# Patient Record
Sex: Male | Born: 1965
Health system: Southern US, Community
[De-identification: ages and names within clinical notes are randomized; demographics above are authoritative.]

## PROBLEM LIST (undated history)

## (undated) DIAGNOSIS — I219 Acute myocardial infarction, unspecified: Secondary | ICD-10-CM

## (undated) DIAGNOSIS — E785 Hyperlipidemia, unspecified: Secondary | ICD-10-CM

## (undated) HISTORY — PX: MOHS SURGERY: SUR867

## (undated) HISTORY — PX: SHOULDER SURGERY: SHX246

## (undated) HISTORY — PX: ROTATOR CUFF REPAIR: SHX139

## (undated) HISTORY — PX: CARDIAC CATHETERIZATION: SHX172

## (undated) HISTORY — DX: Acute myocardial infarction, unspecified: I21.9

## (undated) HISTORY — DX: Hyperlipidemia, unspecified: E78.5

## (undated) HISTORY — PX: CORONARY ANGIOPLASTY WITH STENT PLACEMENT: SHX49

---

## 2009-11-30 ENCOUNTER — Ambulatory Visit: Payer: Self-pay | Admitting: Internal Medicine

## 2013-09-08 ENCOUNTER — Ambulatory Visit: Payer: Self-pay | Admitting: Family Medicine

## 2013-09-08 LAB — CK-MB: CK-MB: 1.1 ng/mL (ref 0.5–3.6)

## 2013-09-08 LAB — TROPONIN I: TROPONIN-I: 0.07 ng/mL — AB

## 2013-09-08 LAB — CK: CK, TOTAL: 221 U/L

## 2013-09-16 ENCOUNTER — Encounter: Payer: Self-pay | Admitting: *Deleted

## 2013-09-17 ENCOUNTER — Ambulatory Visit (INDEPENDENT_AMBULATORY_CARE_PROVIDER_SITE_OTHER): Payer: BC Managed Care – PPO | Admitting: Cardiovascular Disease

## 2013-09-17 ENCOUNTER — Ambulatory Visit: Payer: Self-pay | Admitting: Cardiovascular Disease

## 2013-09-17 ENCOUNTER — Encounter: Payer: Self-pay | Admitting: Cardiovascular Disease

## 2013-09-17 VITALS — BP 164/96 | HR 91 | Ht 69.0 in | Wt 196.8 lb

## 2013-09-17 DIAGNOSIS — I10 Essential (primary) hypertension: Secondary | ICD-10-CM

## 2013-09-17 DIAGNOSIS — R079 Chest pain, unspecified: Secondary | ICD-10-CM

## 2013-09-17 DIAGNOSIS — R000 Tachycardia, unspecified: Secondary | ICD-10-CM | POA: Insufficient documentation

## 2013-09-17 DIAGNOSIS — K219 Gastro-esophageal reflux disease without esophagitis: Secondary | ICD-10-CM

## 2013-09-17 MED ORDER — METOPROLOL TARTRATE 25 MG PO TABS: 25.0000 mg | ORAL_TABLET | Freq: Two times a day (BID) | ORAL | Status: DC

## 2013-09-17 MED ORDER — AMLODIPINE BESYLATE 10 MG PO TABS: 10.0000 mg | ORAL_TABLET | Freq: Every day | ORAL | Status: DC

## 2013-09-17 MED ORDER — NITROGLYCERIN 0.4 MG SL SUBL: 0.4000 mg | SUBLINGUAL_TABLET | SUBLINGUAL | Status: DC | PRN

## 2013-09-17 MED ORDER — OMEPRAZOLE 20 MG PO CPDR: 20.0000 mg | DELAYED_RELEASE_CAPSULE | Freq: Two times a day (BID) | ORAL | Status: DC

## 2013-09-17 NOTE — Patient Instructions (Addendum)
Please start 1/2 amlodipine daily for  Blood pressure Start metoprolol 25 mg twice a day for BP and heart rate  Take NTG sl for chest pain Start aspirin 81 mg x 2  Start omeprazole twice a day for possible GERD  We will schedule a stress test for chest pain  Please call us if you have new issues that need to be addressed before your next appt.  Follow up 2 weeks   [facility]  Your caregiver has ordered a Stress Test with nuclear imaging. The purpose of this test is to evaluate the blood supply to your heart muscle. This procedure is referred to as a "Non-Invasive Stress Test." This is because other than having an IV started in your vein, nothing is inserted or "invades" your body. Cardiac stress tests are done to find areas of poor blood flow to the heart by determining the extent of coronary artery disease (CAD). Some patients exercise on a treadmill, which naturally increases the blood flow to your heart, while others who are  unable to walk on a treadmill due to physical limitations have a pharmacologic/chemical stress agent called Lexiscan . This medicine will mimic walking on a treadmill by temporarily increasing your coronary blood flow.   Please note: these test may take anywhere between 2-4 hours to complete  [facility] TO [facility]  THE VOLUNTEERS AT THE FIRST DESK WILL DIRECT YOU WHERE TO GO  Date of Procedure:___________Tuesday, Feb 24_______________  Arrival Time for Procedure:_________7:45am_____________________  Instructions regarding medication:    _X__:  Hold betablocker(s) night before procedure and morning of procedure:  METOPROLOL & AMLODIPINE    PLEASE NOTIFY THE OFFICE AT LEAST 24 HOURS IN ADVANCE IF YOU ARE UNABLE TO KEEP YOUR APPOINTMENT.  [phone] AND  PLEASE NOTIFY [facility] AT [facility] AT LEAST 24 HOURS IN ADVANCE IF YOU ARE UNABLE TO KEEP YOUR APPOINTMENT. [phone]  How to prepare for your Myoview test:  1. Do not eat  or drink after midnight 2. No caffeine for 24 hours prior to test 3. No smoking 24 hours prior to test. 4. Your medication may be taken with water.  If your doctor stopped a medication because of this test, do not take that medication. 5. Ladies, please do not wear dresses.  Skirts or pants are appropriate. Please wear a short sleeve shirt. 6. No perfume, cologne or lotion. 7. Wear comfortable walking shoes. No heels!

## 2013-09-17 NOTE — Assessment & Plan Note (Signed)
He does report some elevated heart rates in the setting of his chest discomfort. We will start metoprolol as above

## 2013-09-17 NOTE — Assessment & Plan Note (Addendum)
We have recommended that he start omeprazole 20 mg twice a day. Unable to exclude GERD, gastritis, hiatal hernia, ulcer as a cause of his pain

## 2013-09-17 NOTE — Assessment & Plan Note (Signed)
He reports blood pressure has been elevated even prior to his chest pain symptoms. Worse recently. We will start amlodipine 5 g daily and metoprolol 25 mg twice a day. If blood pressure continues to run high, we will increase these medications with slow titration upwards. Amlodipine can be increased up to 10 mg daily, if heart rate continues to run high, metoprolol could be increased up to 50 mg twice a day

## 2013-09-17 NOTE — Progress Notes (Signed)
   Patient ID: Harry [name], male    DOB: [birth year], 48 y.o.   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 He was referred by Dr. Allena KatzPatel  He reports that symptoms typically present with exertion. Last episode of symptoms/chest pain was this morning. He walked to the back of his property which [place] acres. Walking back he was walking quickly and had chest discomfort. Symptoms seemed to resolve with rest.  [facility] with similar symptoms. EKG was relatively nonspecific with no dramatic changes. Cardiac enzymes 0.07. He left AMA and did not go to the emergency room as recommended  Blood pressure has been running high and he wonders if his symptoms could be from severe hypertension. Heart rate seems to run faster in the setting of high blood pressure. He does have significant stress at work. His possibilities have increased on the job.  He does report having previous problems with GERD and he is been taking Zantac 2 pills per day with mild relief but continued chest pain symptoms.  EKG shows normal sinus rhythm with rate 91 beats per minute with T wave abnormality in V4 through V6, inferior leads   Outpatient Encounter Prescriptions as of 09/17/2013  Medication Sig  . Multiple Vitamin (MULTIVITAMIN) tablet Take 1 tablet by mouth daily.    Review of Systems  Constitutional: Negative.   HENT: Negative.   Eyes: Negative.   Respiratory: Negative.   Cardiovascular: Negative.   Gastrointestinal: Negative.   Endocrine: Negative.   Musculoskeletal: Negative.   Skin: Negative.   Allergic/Immunologic: Negative.   Neurological: Negative.   Hematological: Negative.   Psychiatric/Behavioral: Negative.   All other systems reviewed and are negative.    BP 164/96   Pulse 91  Ht 5\' 9"  (1.753 m)  Wt 196 lb 12 oz (89.245 kg)  BMI 29.04 kg/m2  Physical Exam  Nursing note and vitals reviewed. Constitutional: He is oriented to person, place, and time. He appears well-developed and well-nourished.  HENT:  Head: Normocephalic.  Nose: Nose normal.  Mouth/Throat: Oropharynx is clear and moist.  Eyes: Conjunctivae are normal. Pupils are equal, round, and reactive to light.  Neck: Normal range of motion. Neck supple. No JVD present.  Cardiovascular: Normal rate, regular rhythm, S1 normal, S2 normal, normal heart sounds and intact distal pulses.  Exam reveals no gallop and no friction rub.   No murmur heard. Pulmonary/Chest: Effort normal and breath sounds normal. No respiratory distress. He has no wheezes. He has no rales. He exhibits no tenderness.  Abdominal: Soft. Bowel sounds are normal. He exhibits no distension. There is no tenderness.  Musculoskeletal: Normal range of motion. He exhibits no edema and no tenderness.  Lymphadenopathy:    He has no cervical adenopathy.  Neurological: He is alert and oriented to person, place, and time. Coordination normal.  Skin: Skin is warm and dry. No rash noted. No erythema.  Psychiatric: He has a normal mood and affect. His behavior is normal. Judgment and thought content normal.      Assessment and Plan

## 2013-09-17 NOTE — Assessment & Plan Note (Addendum)
For chest pain symptoms, it is unclear if this is from ischemia or other etiology such as GERD. We will treated aggressively and I suggested he start aspirin daily, take nitroglycerin sublingual for any chest pain that does not resolve relatively rapidly.  For blood pressure control we will start amlodipine and metoprolol  PPI medication as above  Stress test has been ordered for next week Suggested he go to the emergency room over the weekend if he has recurrent worsening chest pain symptoms. If this happens, would probably proceed with a catheterization.

## 2013-09-21 ENCOUNTER — Ambulatory Visit: Payer: Self-pay | Admitting: Cardiovascular Disease

## 2013-09-21 DIAGNOSIS — R079 Chest pain, unspecified: Secondary | ICD-10-CM

## 2013-09-22 ENCOUNTER — Encounter: Payer: Self-pay | Admitting: Cardiovascular Disease

## 2013-09-26 ENCOUNTER — Inpatient Hospital Stay: Payer: Self-pay | Admitting: Internal Medicine

## 2013-09-26 LAB — BASIC METABOLIC PANEL
ANION GAP: 2 — AB (ref 7–16)
BUN: 12 mg/dL (ref 7–18)
Calcium, Total: 8.4 mg/dL — ABNORMAL LOW (ref 8.5–10.1)
Chloride: 106 mmol/L (ref 98–107)
Co2: 28 mmol/L (ref 21–32)
Creatinine: 0.87 mg/dL (ref 0.60–1.30)
EGFR (African American): >60
GLUCOSE: 120 mg/dL — AB (ref 65–99)
Osmolality: 273 (ref 275–301)
Potassium: 4.1 mmol/L (ref 3.5–5.1)
SODIUM: 136 mmol/L (ref 136–145)

## 2013-09-26 LAB — CK: CK, Total: 302 U/L

## 2013-09-26 LAB — APTT
ACTIVATED PTT: 85.1 s — AB (ref 23.6–35.9)
Activated PTT: 32.6 secs (ref 23.6–35.9)

## 2013-09-26 LAB — LIPID PANEL
Cholesterol: 185 mg/dL (ref 0–200)
HDL: 24 mg/dL — AB (ref 40–60)
TRIGLYCERIDES: 712 mg/dL — AB (ref 0–200)

## 2013-09-26 LAB — CBC
HCT: 48.1 % (ref 40.0–52.0)
HGB: 16.6 g/dL (ref 13.0–18.0)
MCH: 31.2 pg (ref 26.0–34.0)
MCHC: 34.4 g/dL (ref 32.0–36.0)
MCV: 91 fL (ref 80–100)
Platelet: 179 10*3/uL (ref 150–440)
RBC: 5.3 10*6/uL (ref 4.40–5.90)
RDW: 13 % (ref 11.5–14.5)
WBC: 11.7 10*3/uL — ABNORMAL HIGH (ref 3.8–10.6)

## 2013-09-26 LAB — CK-MB
CK-MB: 2.9 ng/mL (ref 0.5–3.6)
CK-MB: 3.7 ng/mL — ABNORMAL HIGH (ref 0.5–3.6)
CK-MB: 5.1 ng/mL — ABNORMAL HIGH (ref 0.5–3.6)

## 2013-09-26 LAB — TROPONIN I
Troponin-I: 0.13 ng/mL — ABNORMAL HIGH
Troponin-I: 0.49 ng/mL — ABNORMAL HIGH
Troponin-I: 0.79 ng/mL — ABNORMAL HIGH

## 2013-09-27 ENCOUNTER — Encounter: Payer: Self-pay | Admitting: Cardiovascular Disease

## 2013-09-27 DIAGNOSIS — I214 Non-ST elevation (NSTEMI) myocardial infarction: Secondary | ICD-10-CM

## 2013-09-27 DIAGNOSIS — R7989 Other specified abnormal findings of blood chemistry: Secondary | ICD-10-CM

## 2013-09-27 DIAGNOSIS — I2510 Atherosclerotic heart disease of native coronary artery without angina pectoris: Secondary | ICD-10-CM

## 2013-09-27 DIAGNOSIS — R079 Chest pain, unspecified: Secondary | ICD-10-CM

## 2013-09-27 DIAGNOSIS — I10 Essential (primary) hypertension: Secondary | ICD-10-CM

## 2013-09-27 DIAGNOSIS — E785 Hyperlipidemia, unspecified: Secondary | ICD-10-CM

## 2013-09-27 LAB — CBC WITH DIFFERENTIAL/PLATELET
Basophil #: 0 10*3/uL (ref 0.0–0.1)
Basophil %: 0.4 %
EOS PCT: 2.5 %
Eosinophil #: 0.2 10*3/uL (ref 0.0–0.7)
HCT: 45.1 % (ref 40.0–52.0)
HGB: 14.9 g/dL (ref 13.0–18.0)
LYMPHS ABS: 3.3 10*3/uL (ref 1.0–3.6)
Lymphocyte %: 39 %
MCH: 30.4 pg (ref 26.0–34.0)
MCHC: 33.1 g/dL (ref 32.0–36.0)
MCV: 92 fL (ref 80–100)
Monocyte #: 0.7 x10 3/mm (ref 0.2–1.0)
Monocyte %: 7.7 %
NEUTROS ABS: 4.2 10*3/uL (ref 1.4–6.5)
NEUTROS PCT: 50.4 %
Platelet: 149 10*3/uL — ABNORMAL LOW (ref 150–440)
RBC: 4.91 10*6/uL (ref 4.40–5.90)
RDW: 13.6 % (ref 11.5–14.5)
WBC: 8.4 10*3/uL (ref 3.8–10.6)

## 2013-09-27 LAB — BASIC METABOLIC PANEL
ANION GAP: 4 — AB (ref 7–16)
BUN: 11 mg/dL (ref 7–18)
CHLORIDE: 107 mmol/L (ref 98–107)
CREATININE: 0.97 mg/dL (ref 0.60–1.30)
Calcium, Total: 8.3 mg/dL — ABNORMAL LOW (ref 8.5–10.1)
Co2: 27 mmol/L (ref 21–32)
EGFR (African American): >60
EGFR (Non-African Amer.): >60
Glucose: 96 mg/dL (ref 65–99)
Osmolality: 275 (ref 275–301)
Potassium: 3.9 mmol/L (ref 3.5–5.1)
Sodium: 138 mmol/L (ref 136–145)

## 2013-09-27 LAB — TROPONIN I: TROPONIN-I: 2.7 ng/mL — AB

## 2013-09-27 LAB — MAGNESIUM: MAGNESIUM: 1.8 mg/dL

## 2013-09-27 LAB — CK TOTAL AND CKMB (NOT AT ARMC)
CK, Total: 282 U/L
CK-MB: 19.9 ng/mL — AB (ref 0.5–3.6)

## 2013-09-27 LAB — APTT: Activated PTT: 68.9 secs — ABNORMAL HIGH (ref 23.6–35.9)

## 2013-09-28 ENCOUNTER — Encounter: Payer: Self-pay | Admitting: Cardiovascular Disease

## 2013-09-28 DIAGNOSIS — I214 Non-ST elevation (NSTEMI) myocardial infarction: Secondary | ICD-10-CM

## 2013-09-28 LAB — BASIC METABOLIC PANEL
ANION GAP: 7 (ref 7–16)
BUN: 11 mg/dL (ref 7–18)
CALCIUM: 8.9 mg/dL (ref 8.5–10.1)
Chloride: 106 mmol/L (ref 98–107)
Co2: 26 mmol/L (ref 21–32)
Creatinine: 1 mg/dL (ref 0.60–1.30)
EGFR (African American): >60
GLUCOSE: 102 mg/dL — AB (ref 65–99)
Osmolality: 277 (ref 275–301)
POTASSIUM: 4.3 mmol/L (ref 3.5–5.1)
Sodium: 139 mmol/L (ref 136–145)

## 2013-09-29 ENCOUNTER — Encounter: Payer: Self-pay | Admitting: Cardiovascular Disease

## 2013-10-06 ENCOUNTER — Encounter: Payer: Self-pay | Admitting: Cardiovascular Disease

## 2013-10-06 ENCOUNTER — Ambulatory Visit (INDEPENDENT_AMBULATORY_CARE_PROVIDER_SITE_OTHER): Payer: BC Managed Care – PPO | Admitting: Cardiovascular Disease

## 2013-10-06 VITALS — BP 120/84 | Resp 72 | Ht 69.0 in | Wt 197.5 lb

## 2013-10-06 DIAGNOSIS — I10 Essential (primary) hypertension: Secondary | ICD-10-CM

## 2013-10-06 DIAGNOSIS — R000 Tachycardia, unspecified: Secondary | ICD-10-CM

## 2013-10-06 DIAGNOSIS — E785 Hyperlipidemia, unspecified: Secondary | ICD-10-CM

## 2013-10-06 DIAGNOSIS — I2510 Atherosclerotic heart disease of native coronary artery without angina pectoris: Secondary | ICD-10-CM | POA: Insufficient documentation

## 2013-10-06 DIAGNOSIS — K219 Gastro-esophageal reflux disease without esophagitis: Secondary | ICD-10-CM

## 2013-10-06 NOTE — Assessment & Plan Note (Signed)
He'll continue on omeprazole

## 2013-10-06 NOTE — Assessment & Plan Note (Addendum)
Cardiac catheterization last week showing severe proximal LAD disease. Stent placed no resolution of symptoms. We'll continue dual platelet therapy likely indefinitely. He'll continue beta blocker and statin

## 2013-10-06 NOTE — Assessment & Plan Note (Signed)
Blood pressure is well controlled on today's visit. No changes made to the medications. 

## 2013-10-06 NOTE — Progress Notes (Signed)
Patient ID: Harry [name], male    DOB: 03-16-1966, 48 y.o.   MRN: [id number]  HPI Comments: Mr. 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 Allena KatzPatel. He presents for followup after cardiac catheterization.  Initially he had a treadmill Myoview that showed no significant ischemia. This was done 09/22/2013. He had 1 mm ST depression in V5 and V6 at peak stress, attenuation corrected pictures showing apical thinning with no ischemia. Clinical correlation was recommended given the EKG changes and apical thinning.  He continued to have chest pain and presented to the hospital and underwent cardiac catheterization that showed 90+ percent lesion of his proximal LAD. DES was placed. No complications while in the hospital. In followup today, he denies any further episodes of chest pain. Overall he feels well, relieved that his symptoms have resolved. He is tolerating low-dose aspirin with effient  EKG shows normal sinus rhythm with rate 72 beats per minute with nonspecific ST and T wave abnormality in inferior leads, anterolateral leads   Outpatient Encounter Prescriptions as of 10/06/2013  Medication Sig  . amLODipine (NORVASC) 10 MG tablet Take 1 tablet (10 mg total) by mouth daily.  Marland Kitchen. aspirin 81 MG tablet Take 81 mg by mouth daily.  . Atorvastatin Calcium (LIPITOR PO) Take 40 mg by mouth daily.   . metoprolol tartrate (LOPRESSOR) 25 MG tablet Take 1 tablet (25 mg total) by mouth 2 (two) times daily.  . Multiple Vitamin (MULTIVITAMIN) tablet Take 1 tablet by mouth daily.  . nitroGLYCERIN (NITROSTAT) 0.4 MG SL tablet Place 1 tablet (0.4 mg total) under the tongue every 5 (five) minutes as needed for chest pain.  Marland Kitchen. omeprazole (PRILOSEC) 20 MG capsule Take 1 capsule (20 mg total) by mouth 2 (two) times daily before a meal.  . Prasugrel HCl  (EFFIENT PO) Take 10 mg by mouth daily.     Review of Systems  Constitutional: Negative.   HENT: Negative.   Eyes: Negative.   Respiratory: Negative.   Cardiovascular: Negative.   Gastrointestinal: Negative.   Endocrine: Negative.   Musculoskeletal: Negative.   Skin: Negative.   Allergic/Immunologic: Negative.   Neurological: Negative.   Hematological: Negative.   Psychiatric/Behavioral: Negative.   All other systems reviewed and are negative.    BP 120/84  Resp 72  Ht 5\' 9"  (1.753 m)  Wt 197 lb 8 oz (89.585 kg)  BMI 29.15 kg/m2  Physical Exam  Nursing note and vitals reviewed. Constitutional: He is oriented to person, place, and time. He appears well-developed and well-nourished.  HENT:  Head: Normocephalic.  Nose: Nose normal.  Mouth/Throat: Oropharynx is clear and moist.  Eyes: Conjunctivae are normal. Pupils are equal, round, and reactive to light.  Neck: Normal range of motion. Neck supple. No JVD present.  Cardiovascular: Normal rate, regular rhythm, S1 normal, S2 normal, normal heart sounds and intact distal pulses.  Exam reveals no gallop and no friction rub.   No murmur heard. Pulmonary/Chest: Effort normal and breath sounds normal. No respiratory distress. He has no wheezes. He has no rales. He exhibits no tenderness.  Abdominal: Soft. Bowel sounds are normal. He exhibits no distension. There is no tenderness.  Musculoskeletal: Normal range of motion. He exhibits no edema and no tenderness.  Lymphadenopathy:    He has no cervical adenopathy.  Neurological: He is alert and oriented  to person, place, and time. Coordination normal.  Skin: Skin is warm and dry. No rash noted. No erythema.  Psychiatric: He has a normal mood and affect. His behavior is normal. Judgment and thought content normal.      Assessment and Plan

## 2013-10-06 NOTE — Patient Instructions (Addendum)
You are doing well. No medication changes were made.  Cholesterol in 3 months, fasting  Please call us if you have new issues that need to be addressed before your next appt.  Your physician wants you to follow-up in: 6 months.  You will receive a reminder letter in the mail two months in advance. If you don't receive a letter, please call our office to schedule the follow-up appointment.

## 2013-11-11 ENCOUNTER — Telehealth: Payer: Self-pay | Admitting: *Deleted

## 2013-11-11 NOTE — Telephone Encounter (Signed)
[facility] pt is not interested in rehab because he does not have time, no desire, and children in daycare

## 2013-12-24 NOTE — Progress Notes (Signed)
 Subjective:     Patient ID: Harry [name] is a 48 y.o. male.  HPI : PATIENT FELL 2 WKS AGO AND LANDED ON HIS LEFT KNEE. HAS A HEALING LACERATION. NO SIGNS OF INFECTION. STATES THERE WAS A LARGE HEMATOMA INITIALLY. NOW WITH BRUISING AROUND HIS ANKLE. STATES HIS LOWER EXTREM HAVE BEEN SWELLIN SINCE HE HAD A STINT PLACED 4 WKS AGO AND STARTED TAKING HIS CURRENT MEDICATION. TET UPT.   Past Medical History:  has a past medical history of CAD (coronary artery disease); Hypertension; and Hyperlipidemia. Past Surgical History:  has no past surgical history on file. Social History:  reports that he has never smoked. He has never used smokeless tobacco. His alcohol and drug histories are not on file. Current Medications: has a current medication list which includes the following prescription(s): amlodipine  besylate, aspirin, atorvastatin  calcium , metoprolol  tartrate, multivitamin, and prasugrel . Allergies: has No Known Allergies.  This an established patient is here today for: office visit.  ROS     Objective:   Physical Exam  Constitutional: He appears well-developed and well-nourished.  Cardiovascular: Normal rate and regular rhythm.   Pulmonary/Chest: Effort normal.  Musculoskeletal:  HEALING LACERATION INFERIOR LEFT KNEE. ECHYMOSIS NOTED LEFT ANKLE MEDIAL AND LATERAL. ROM INTACT.   Nursing note and vitals reviewed.      Assessment:     Laceration of left knee, initial encounter  Hematoma  Stasis edema of both lower extremities      Plan:     Patient Instructions  ELEVATE FEET ABOVE WAIST AS MUCH AS POSSIBLE. FOLLOW UP WITH YOUR CARDIOLOGIST AND DISCUSS THE ON GOING SWELLING OF YOUR LOWER EXTREM.        Hematoma A hematoma is a collection of blood under the skin, in an organ, in a body space, in a joint space, or in other tissue. The blood can clot to form a lump that you can see and feel. The lump is often firm and may sometimes become sore and tender. Most hematomas  get better in a few days to weeks. However, some hematomas may be serious and require medical care. Hematomas can range in size from very small to very large. CAUSES  A hematoma can be caused by a blunt or penetrating injury. It can also be caused by spontaneous leakage from a blood vessel under the skin. Spontaneous leakage from a blood vessel is more likely to occur in older people, especially those taking blood thinners. Sometimes, a hematoma can develop after certain medical procedures. SIGNS AND SYMPTOMS  A firm lump on the body. Possible pain and tenderness in the area. Bruising.Blue, dark blue, purple-red, or yellowish skin may appear at the site of the hematoma if the hematoma is close to the surface of the skin. For hematomas in deeper tissues or body spaces, the signs and symptoms may be subtle. For example, an intra-abdominal hematoma may cause abdominal pain, weakness, fainting, and shortness of breath. An intracranial hematoma may cause a headache or symptoms such as weakness, trouble speaking, or a change in consciousness. DIAGNOSIS  A hematoma can usually be diagnosed based on your medical history and a physical exam. Imaging tests may be needed if your health care provider suspects a hematoma in deeper tissues or body spaces, such as the abdomen, head, or chest. These tests may include ultrasonography or a CT scan.  TREATMENT  Hematomas usually go away on their own over time. Rarely does the blood need to be drained out of the body. Large hematomas or those  that may affect vital organs will sometimes need surgical drainage or monitoring. HOME CARE INSTRUCTIONS  Apply ice to the injured area:  Put ice in a plastic bag.  Place a towel between your skin and the bag.  Leave the ice on for 20 minutes, 2-3 times a day for the first 1 to 2 days.  After the first 2 days, switch to using warm compresses on the hematoma.  Elevate the injured area to help decrease pain and swelling.  Wrapping the area with an elastic bandage may also be helpful. Compression helps to reduce swelling and promotes shrinking of the hematoma. Make sure the bandage is not wrapped too tight.  If your hematoma is on a lower extremity and is painful, crutches may be helpful for a couple days.  Only take over-the-counter or prescription medicines as directed by your health care provider. SEEK IMMEDIATE MEDICAL CARE IF:  You have increasing pain, or your pain is not controlled with medicine.  You have a fever.  You have worsening swelling or discoloration.  Your skin over the hematoma breaks or starts bleeding.  Your hematoma is in your chest or abdomen and you have weakness, shortness of breath, or a change in consciousness. Your hematoma is on your scalp (caused by a fall or injury) and you have a worsening headache or a change in alertness or consciousness. MAKE SURE YOU:  Understand these instructions. Will watch your condition. Will get help right away if you are not doing well or get worse. Document Released: 02/27/2004 Document Revised: 03/17/2013 Document Reviewed: 12/23/2012 [facility] Patient Information 2015 [place], [place]. This information is not intended to replace advice given to you by your health care provider. Make sure you discuss any questions you have with your health care provider. Contusion A contusion is a deep bruise. Contusions are the result of an injury that caused bleeding under the skin. The contusion may turn blue, purple, or yellow. Minor injuries will give you a painless contusion, but more severe contusions may stay painful and swollen for a few weeks.  CAUSES  A contusion is usually caused by a blow, trauma, or direct force to an area of the body. SYMPTOMS  Swelling and redness of the injured area. Bruising of the injured area. Tenderness and soreness of the injured area. Pain. DIAGNOSIS  The diagnosis can be made by taking a history and physical exam. An  X-ray, CT scan, or MRI may be needed to determine if there were any associated injuries, such as fractures. TREATMENT  Specific treatment will depend on what area of the body was injured. In general, the best treatment for a contusion is resting, icing, elevating, and applying cold compresses to the injured area. Over-the-counter medicines may also be recommended for pain control. Ask your caregiver what the best treatment is for your contusion. HOME CARE INSTRUCTIONS  Put ice on the injured area. Put ice in a plastic bag. Place a towel between your skin and the bag. Leave the ice on for 15-20 minutes, 3-4 times a day, or as directed by your health care provider. Only take over-the-counter or prescription medicines for pain, discomfort, or fever as directed by your caregiver. Your caregiver may recommend avoiding anti-inflammatory medicines (aspirin, ibuprofen, and naproxen) for 48 hours because these medicines may increase bruising. Rest the injured area. If possible, elevate the injured area to reduce swelling. SEEK IMMEDIATE MEDICAL CARE IF:  You have increased bruising or swelling. You have pain that is getting worse. Your swelling or  pain is not relieved with medicines. MAKE SURE YOU:  Understand these instructions. Will watch your condition. Will get help right away if you are not doing well or get worse. Document Released: 04/24/2005 Document Revised: 07/20/2013 Document Reviewed: 05/20/2011 [facility] Patient Information 2015 [place], [place]. This information is not intended to replace advice given to you by your health care provider. Make sure you discuss any questions you have with your health care provider.

## 2014-01-06 ENCOUNTER — Other Ambulatory Visit: Payer: BC Managed Care – PPO

## 2014-01-07 ENCOUNTER — Ambulatory Visit (INDEPENDENT_AMBULATORY_CARE_PROVIDER_SITE_OTHER): Payer: BC Managed Care – PPO

## 2014-01-07 DIAGNOSIS — Z79899 Other long term (current) drug therapy: Secondary | ICD-10-CM

## 2014-01-07 DIAGNOSIS — I2510 Atherosclerotic heart disease of native coronary artery without angina pectoris: Secondary | ICD-10-CM

## 2014-01-08 LAB — LIPID PANEL
CHOLESTEROL TOTAL: 133 mg/dL (ref 100–199)
Chol/HDL Ratio: 3.9 ratio units (ref 0.0–5.0)
HDL: 34 mg/dL — ABNORMAL LOW (ref >39)
LDL CALC: 61 mg/dL (ref 0–99)
Triglycerides: 191 mg/dL — ABNORMAL HIGH (ref 0–149)
VLDL CHOLESTEROL CAL: 38 mg/dL (ref 5–40)

## 2014-01-08 LAB — HEPATIC FUNCTION PANEL
ALT: 63 IU/L — AB (ref 0–44)
AST: 38 IU/L (ref 0–40)
Albumin: 4.7 g/dL (ref 3.5–5.5)
Alkaline Phosphatase: 136 IU/L — ABNORMAL HIGH (ref 39–117)
Bilirubin, Direct: 0.19 mg/dL (ref 0.00–0.40)
Total Bilirubin: 1.1 mg/dL (ref 0.0–1.2)
Total Protein: 7.1 g/dL (ref 6.0–8.5)

## 2014-01-19 ENCOUNTER — Other Ambulatory Visit: Payer: Self-pay | Admitting: *Deleted

## 2014-01-19 MED ORDER — ATORVASTATIN CALCIUM 40 MG PO TABS: 40.0000 mg | ORAL_TABLET | Freq: Every day | ORAL | Status: DC

## 2014-01-19 NOTE — Telephone Encounter (Signed)
Requested Prescriptions   Signed Prescriptions Disp Refills  . atorvastatin (LIPITOR) 40 MG tablet 30 tablet 3    Sig: Take 1 tablet (40 mg total) by mouth daily.    Authorizing Marguerette Sheller: Antonieta IbaGOLLAN, TIMOTHY J    Ordering User: Kendrick FriesLOPEZ, MARINA C

## 2014-01-27 ENCOUNTER — Other Ambulatory Visit: Payer: Self-pay | Admitting: *Deleted

## 2014-01-27 MED ORDER — ATORVASTATIN CALCIUM 40 MG PO TABS: 40.0000 mg | ORAL_TABLET | Freq: Every day | ORAL | Status: DC

## 2014-05-02 ENCOUNTER — Other Ambulatory Visit: Payer: Self-pay

## 2014-05-02 ENCOUNTER — Other Ambulatory Visit: Payer: Self-pay | Admitting: *Deleted

## 2014-05-02 MED ORDER — METOPROLOL TARTRATE 25 MG PO TABS: 25.0000 mg | ORAL_TABLET | Freq: Two times a day (BID) | ORAL | Status: DC

## 2014-05-02 MED ORDER — ATORVASTATIN CALCIUM 40 MG PO TABS: 40.0000 mg | ORAL_TABLET | Freq: Every day | ORAL | Status: DC

## 2014-05-02 MED ORDER — PRASUGREL HCL 10 MG PO TABS: 10.0000 mg | ORAL_TABLET | Freq: Every day | ORAL | Status: DC

## 2014-05-02 MED ORDER — OMEPRAZOLE 20 MG PO CPDR: 20.0000 mg | DELAYED_RELEASE_CAPSULE | Freq: Two times a day (BID) | ORAL | Status: DC

## 2014-05-02 NOTE — Telephone Encounter (Signed)
Pt is out of effient

## 2014-05-04 ENCOUNTER — Encounter: Payer: Self-pay | Admitting: Cardiovascular Disease

## 2014-05-04 ENCOUNTER — Ambulatory Visit (INDEPENDENT_AMBULATORY_CARE_PROVIDER_SITE_OTHER): Payer: BC Managed Care – PPO | Admitting: Cardiovascular Disease

## 2014-05-04 VITALS — BP 130/90 | HR 72 | Ht 70.0 in | Wt 194.5 lb

## 2014-05-04 DIAGNOSIS — I2510 Atherosclerotic heart disease of native coronary artery without angina pectoris: Secondary | ICD-10-CM

## 2014-05-04 DIAGNOSIS — R000 Tachycardia, unspecified: Secondary | ICD-10-CM

## 2014-05-04 DIAGNOSIS — I10 Essential (primary) hypertension: Secondary | ICD-10-CM

## 2014-05-04 DIAGNOSIS — E785 Hyperlipidemia, unspecified: Secondary | ICD-10-CM

## 2014-05-04 MED ORDER — PRASUGREL HCL 10 MG PO TABS: 10.0000 mg | ORAL_TABLET | Freq: Every day | ORAL | Status: DC

## 2014-05-04 NOTE — Patient Instructions (Addendum)
You are doing well. No medication changes were made.  Please call us if you have new issues that need to be addressed before your next appt.  Your physician wants you to follow-up in: 12 months.  You will receive a reminder letter in the mail two months in advance. If you don't receive a letter, please call our office to schedule the follow-up appointment. 

## 2014-05-04 NOTE — Assessment & Plan Note (Signed)
Cholesterol is at goal on the current lipid regimen. No changes to the medications were made.  

## 2014-05-04 NOTE — Progress Notes (Signed)
Patient ID: Luis AbedChristophe A Pitones, male    DOB: Jul 06, 1966, 48 y.o.   MRN: [id number]  HPI Comments: 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 DES placed.  patient of Dr. Allena KatzPatel.   In followup today, he reports that he is doing well. No further chest pain symptoms. 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 amLODipine (NORVASC) 10 MG tablet Take 1 tablet (10 mg total) by mouth daily.  Marland Kitchen. aspirin 81 MG tablet Take 81 mg by mouth daily.  Marland Kitchen. atorvastatin (LIPITOR) 40 MG tablet Take 1 tablet (40 mg total) by mouth daily.  . metoprolol tartrate (LOPRESSOR) 25 MG tablet Take 25 mg by mouth daily.  . Multiple Vitamin (MULTIVITAMIN) tablet Take 1 tablet by mouth daily.  . nitroGLYCERIN (NITROSTAT) 0.4 MG SL tablet Place 1 tablet (0.4 mg total) under the tongue every 5 (five) minutes as needed for chest pain.  Marland Kitchen. omeprazole (PRILOSEC) 20 MG capsule Take 1 capsule (20 mg total) by mouth 2 (two) times daily before a meal.  . prasugrel (EFFIENT) 10 MG TABS tablet Take 1 tablet (10 mg total) by mouth daily.    Review of Systems  Constitutional: Negative.   HENT: Negative.   Eyes: Negative.   Respiratory: Negative.   Cardiovascular: Negative.   Gastrointestinal: Negative.   Endocrine: Negative.    Musculoskeletal: Negative.   Skin: Negative.   Allergic/Immunologic: Negative.   Neurological: Negative.   Hematological: Negative.   Psychiatric/Behavioral: Negative.   All other systems reviewed and are negative.   BP 130/90  Pulse 72  Ht 5\' 10"  (1.778 m)  Wt 194 lb 8 oz (88.225 kg)  BMI 27.91 kg/m2  Physical Exam  Nursing note and vitals reviewed. Constitutional: He is oriented to person, place, and time. He appears well-developed and well-nourished.  HENT:  Head: Normocephalic.  Nose: Nose normal.  Mouth/Throat: Oropharynx is clear and moist.  Eyes: Conjunctivae are normal. Pupils are equal, round, and reactive to light.  Neck: Normal range of motion. Neck supple. No JVD present.  Cardiovascular: Normal rate, regular rhythm, S1 normal, S2 normal, normal heart sounds and intact distal pulses.  Exam reveals no gallop and no friction rub.   No murmur heard. Pulmonary/Chest: Effort normal and breath sounds normal. No respiratory distress. He has no wheezes. He has no rales. He exhibits no tenderness.  Abdominal: Soft. Bowel sounds are normal. He exhibits no distension. There is no tenderness.  Musculoskeletal: Normal range of motion. He exhibits no edema and no tenderness.  Lymphadenopathy:    He has no cervical adenopathy.  Neurological: He is alert and oriented to person, place, and time. Coordination normal.  Skin: Skin is warm and dry. No rash noted. No erythema.  Psychiatric: He has a normal mood and affect. His behavior is normal. Judgment and thought content normal.  Assessment and Plan

## 2014-05-04 NOTE — Assessment & Plan Note (Signed)
DES to the proximal LAD, stable with no recurrent anginal symptoms Continue aspirin with Effient. Co-pay card provided

## 2014-05-04 NOTE — Assessment & Plan Note (Signed)
He reduced the metoprolol dose secondary to low blood pressure. We'll continue him on his current regimen

## 2014-09-25 ENCOUNTER — Other Ambulatory Visit: Payer: Self-pay | Admitting: Cardiovascular Disease

## 2014-11-02 ENCOUNTER — Other Ambulatory Visit: Payer: Self-pay | Admitting: *Deleted

## 2014-11-02 MED ORDER — METOPROLOL TARTRATE 25 MG PO TABS: 25.0000 mg | ORAL_TABLET | Freq: Every day | ORAL | Status: DC

## 2014-11-06 ENCOUNTER — Other Ambulatory Visit: Payer: Self-pay | Admitting: Cardiovascular Disease

## 2014-11-19 NOTE — Discharge Summary (Signed)
PATIENT NAME:  Harry [name], Harry [name] MR#:  [id number] DATE OF BIRTH:  [birth year]  DATE OF ADMISSION:  09/26/2013 DATE OF DISCHARGE:  09/28/2013  For [name] detailed note, please take [name] look at the history and physical done on admission by Dr. Randol KernElgergawy.   DIAGNOSES AT DISCHARGE: 1.  Non-ST-elevation myocardial infarction.  2.  Hypertension.  3.  Hyperlipidemia.  4.  Gastroesophageal reflux disease.    DIET: The patient is being discharged on [name] low-sodium, low-fat diet.   ACTIVITY: As tolerated.   FOLLOWUP: Dr. Kirke CorinArida and Dr. Mariah MillingGollan in the next 1 to 2 weeks.   DISCHARGE MEDICATIONS: Amlodipine 10 mg daily, aspirin 81 mg daily, metoprolol tartrate 25 mg b.i.d., multivitamin daily, sublingual nitroglycerin as needed, omeprazole 20 mg b.i.d., Levitra 5 mg daily, atorvastatin 40 mg daily, Effient 10 mg daily.   CONSULTANTS DURING THE HOSPITAL COURSE: Dr. Lorine BearsMuhammad Arida and Dr. Dossie Arbourim Gollan from cardiology.   PERTINENT STUDIES DONE DURING THE HOSPITAL COURSE: [name] chest x-ray done on admission showing no acute cardiopulmonary process. [name] cardiac catheterization done on September 27, 2013, showing [name] 99% stenosis in the mid LAD. Status post drug-eluting stent placement.   HOSPITAL COURSE: This is [name] 49 year old male who presented to the hospital with worsening chest pain worrisome for unstable angina.   1.  Non-ST-elevation myocardial infarction. This was the likely diagnosis, given the patient's progressive chest pains and elevated troponin. The patient apparently had [name] Myoview done [name] week prior to admission which was essentially normal, but continued to have worsening chest pain and presented to the ER with [name] slightly elevated troponin. The patient was admitted to the hospital, started on aspirin, heparin drip, maintained on his beta blocker and statin. [name] cardiology consult was obtained. The patient was seen by Dr. Kirke CorinArida. The patient underwent [name] cardiac catheterization the day after admission, which showed [name] 99%  stenosis of the mid LAD. The patient underwent [name] drug-eluting stent placement to the mid LAD. Post cath, the patient has been chest pain-free and hemodynamically stable. He is therefore presently being discharged on his aspirin, Effient, beta blocker and statin as stated. The patient will have follow-up with his cardiologist as an outpatient in the next 1 to 2 weeks.  2.  Hypertension. The patient remained hemodynamically stable. He will continue his metoprolol.  3.  GERD. The patient was maintained on his Protonix. He will resume that.  4.  Hyperlipidemia. The patient was not on [name] statin prior to coming in, but given his recent cardiac stent placement, he has been started on low-dose atorvastatin.   CODE STATUS: The patient is [name] full code.   DISPOSITION: He is being discharged home.   TIME SPENT: 40 minutes.   ____________________________ Rolly PancakeVivek J. Cherlynn KaiserSainani, MD vjs:cg D: 09/28/2013 16:38:27 ET T: 09/29/2013 05:45:38 ET JOB#: [id number]  cc: Rolly PancakeVivek J. Cherlynn KaiserSainani, MD, <Dictator> Muhammad [name]. Kirke CorinArida, MD Antonieta Ibaimothy J. Gollan, MD Houston SirenVIVEK J SAINANI MD ELECTRONICALLY SIGNED 10/10/2013 22:34

## 2014-11-19 NOTE — Consult Note (Signed)
Admit Diagnosis:   CHEST PAIN: Onset Date: 26-Sep-2013, Status: Active, Description: CHEST PAIN   Present Illness Patient is a 49 yo who presents for evaluation of CP  Episodes of chest pain began a few weeks ago  He went to Urgent Care on 2/11.  Left AMA He was seen by Johnny Bridge in clinic on 2/20  Complained of chest heaviness with exertion. Also had some symtpoms that sugg possible reflux.  He underwent a stress myoview  Walked 8 min  EKG without changes  Myoview reported normal.   Yesterday evening he was going to get horses in before going to bed  This was after midnight  Wnet to barn  Pushed hay around  Started gettting chst heaviness  Came back to house  Difficult walking through snow  SOB Once he got into the house is when he started feeling like a truck was layingon his chest  Also complained of L arm discomfort and back pain.  Woke his wife.  Told her he needed to go to hosptital  She drove him in While walking into the hospital discomfort went away. he had a little discomfort in ER but none since.   Home Medications: Medication Instructions Status  amLODIPine 10 mg oral tablet 1 tab(s) orally once a day Active  aspirin 81 mg oral tablet 1 tab(s) orally once a day Active  Metoprolol Tartrate 25 mg oral tablet 1 tab(s) orally 2 times a day Active  multivitamin 1 tab(s) orally once a day Active  nitroglycerin 0.4 mg sublingual tablet 1 tab(s) sublingual every 5 minutes, As Needed - for Chest Pain Active  omeprazole 20 mg oral delayed release capsule 1 cap(s) orally 2 times a day Active  Levitra 5 mg oral tablet 1 tab(s) orally once a day, As Needed Active    No Known Allergies:   Case History and Physical Exam:  Chief Complaint Chest Pain   Past Medical Health Hypertension, GERD   Past Surgical History None   Primary Care Provider Cardiology:  Gollan   Family History Coronary Artery Disease  Sister:  ? CAD   HEENT PERLA   Neck/Nodes JVP normal  No bruits    Chest/Lungs Clear   Cardiovascular No Murmurs or Gallops  RRR  Normal S1, S2  No S3  No murmurs   Abdomen Supple, nontender.  No masses  No hepatomegaly.   Genitalia Not examined   Rectal Not examined   Musculoskeletal Full range of motion   Neurological Grossly WNL   Skin Dry  WNL   Nursing/Ancillary Notes: **Vital Signs.:   01-Mar-15 12:54  Temperature Temperature (F) 98.2  Celsius 36.7  Pulse Pulse 58  Respirations Respirations 23  Systolic BP Systolic BP 563  Diastolic BP (mmHg) Diastolic BP (mmHg) 58  Mean BP 77  Pulse Ox % Pulse Ox % 98  Oxygen Delivery Room Air/ 21 %  Pulse Ox Heart Rate 58   Routine Chem:  01-Mar-15 05:08   Result Comment VLDL/LDL - Unable to report VLDL and LDL due to a  - Triglyceride value that is 400 mg/dL or   - greater.  Result(s) reported on 26 Sep 2013 at 07:55AM.  Cholesterol, Serum 185  Triglycerides, Serum  712  HDL (INHOUSE)  24  VLDL Cholesterol Calculated SEE COMMENT  LDL Cholesterol Calculated SEE COMMENT  Glucose, Serum  120  BUN 12  Creatinine (comp) 0.87  Sodium, Serum 136  Potassium, Serum 4.1  Chloride, Serum 106  CO2, Serum 28  Calcium (  Total), Serum  8.4  Anion Gap  2  Osmolality (calc) 273  eGFR (African American) >60  eGFR (Non-African American) >60 (eGFR values <38m/min/1.73 m2 may be an indication of chronic kidney disease (CKD). Calculated eGFR is useful in patients with stable renal function. The eGFR calculation will not be reliable in acutely ill patients when serum creatinine is changing rapidly. It is not useful in  patients on dialysis. The eGFR calculation may not be applicable to patients at the low and high extremes of body sizes, pregnant women, and vegetarians.)  Cardiac:  01-Mar-15 05:08   Troponin I  0.13 (0.00-0.05 0.05 ng/mL or less: NEGATIVE  Repeat testing in 3-6 hrs  if clinically indicated. >0.05 ng/mL: POTENTIAL  MYOCARDIAL INJURY. Repeat  testing in 3-6 hrs if  clinically  indicated. NOTE: An increase or decrease  of 30% or more on serial  testing suggests a  clinically important change)  CPK-MB, Serum 2.9 (Result(s) reported on 26 Sep 2013 at 08:07AM.)  CK, Total 302 (39-308 NOTE: NEW REFERENCE RANGE  08/30/2013)  Routine Coag:  01-Mar-15 05:08   Activated PTT (APTT) 32.6 (A HCT value >55% may artifactually increase the APTT. In one study, the increase was an average of 19%. Reference: "Effect on Routine and Special Coagulation Testing Values of Citrate Anticoagulant Adjustment in Patients with High HCT Values." American Journal of Clinical Pathology 2006;126:400-405.)  Routine Hem:  01-Mar-15 05:08   WBC (CBC)  11.7  RBC (CBC) 5.30  Hemoglobin (CBC) 16.6  Hematocrit (CBC) 48.1  Platelet Count (CBC) 179 (Result(s) reported on 26 Sep 2013 at 05:23AM.)  MCV 91  MCH 31.2  MCHC 34.4  RDW 13.0   XRay:    01-Mar-15 05:20, Chest Portable Single View  Chest Portable Single View   REASON FOR EXAM:    CP  COMMENTS:       PROCEDURE: DXR - DXR PORTABLE CHEST SINGLE VIEW  - Sep 26 2013  5:20AM     CLINICAL DATA:  Chest pain for 2 weeks.    EXAM:  PORTABLE CHEST - 1 VIEW    COMPARISON:  None.    FINDINGS:  The lungs are well-aerated and clear. There is no evidence of focal  opacification, pleural effusion or pneumothorax.  The cardiomediastinal silhouette is within normal limits. No acute  osseous abnormalities are seen.     IMPRESSION:  No acute cardiopulmonary process seen.      Electronically Signed    By: JGarald BaldingM.D.    On: 09/26/2013 05:24         Verified By: JEFFREY . CHANG, M.D.,    Impression Patient is a 49yo with no known CAD  Has a 3 wk history of exertional chest discomfort  Myoview reported normal per patient. Presents after severe episode early this am  Trop is very mildly elevated Agree with heparin  May need to hold b blocker (HR and BP low)  Plan for L heart cath in AM Patient agrees to proceed.     Dyslipidemia.  Patient with signif elevation of trig  Will check Hgb A1C  Start statin for now.  May need additional agents  HTN  Follow   Electronic Signatures: RDorris Carnes(MD)  (Signed 01-Mar-15 15:18)  Authored: Health Issues, General Aspect/Present Illness, Home Medications, Allergies, History and Physical Exam, Vital Signs, Labs, Radiology, Impression/Plan   Last Updated: 01-Mar-15 15:18 by RDorris Carnes(MD)

## 2014-11-19 NOTE — H&P (Signed)
PATIENT NAME:  Harry [name], Harry [name] MR#:  [id number] DATE OF BIRTH:  03-28-1966  DATE OF ADMISSION:  09/26/2013  REFERRING PHYSICIAN:  Dr. Chiquita Loth.  PRIMARY CARE PHYSICIAN:  None.   CARDIOLOGY:  Dr. Mariah Milling.   CHIEF COMPLAINT:  Chest pain.   HISTORY OF PRESENT ILLNESS:  This is a 49 year old male with past medical history of hypertension, presents with complaints of chest pain, the patient reports his pain has been going off and on for the last two weeks where he saw Dr. Mariah Milling last week where he had his nuclear medicine stress test done on February 24th which was essentially negative, the patient presents today with what he describes as chest pain happening this morning, reports at 2:00 a.m. he went to the farm to take care of the horses, but reports he was doing some exertional physical activity, when he started to have midsternal chest pain, crushing in quality, radiating to the left arm, accompanied by mild shortness of breath, palpitation and nausea.  Denies any diaphoresis or lightheadedness or dizziness, upon presentation, the patient had his chest pain resolved by then, he already took two aspirin yesterday, but he took four baby aspirin at night, the patient reports he took half tablet of Levitra yesterday night, the patient's EKG did not show any changes.  His troponin came back elevated at 0.13, the patient was given nitro paste and started on a heparin drip.  Currently denies any chest pain, hospitalist service requested to admit the patient for further treatment of his non-STEMI.   PAST MEDICAL HISTORY:  Hypertension.   PAST SURGICAL HISTORY:  None.   SOCIAL HISTORY:  The patient uses smokeless tobacco, dipping, but no smoking.  No alcohol.  No illicit drug use.   FAMILY HISTORY:  Significant for congestive heart failure in his father, but denies any coronary artery disease at a young age.   ALLERGIES:  No known drug allergies.   HOME MEDICATIONS:  1.  Norvasc 10 mg daily.  2.   Aspirin 160 mg daily.  3.  Metoprolol 25 mg twice daily.  4.  Multivitamin 1 tablet daily.  5.  Sublingual nitro as needed.  6.  Omeprazole 20 mg 2 times a day before meals.   REVIEW OF SYSTEMS:  CONSTITUTIONAL:  Denies fever, chills, fatigue, weakness, weight gain, weight loss.  EYES:  Denies blurry vision, double vision, inflammation, glaucoma.  EARS, NOSE, THROAT:  Denies tinnitus, ear pain, hearing loss, epistaxis or discharge.  RESPIRATORY:  Denies cough, wheezing, hemoptysis, or COPD.  CARDIOVASCULAR:  Reports chest pain.  Denies edema, syncope.  Reports palpitation.  GASTROINTESTINAL:  Reports nausea.  Denies vomiting, diarrhea, abdominal pain, hematemesis, GERD.  GENITOURINARY:  Denies dysuria, hematuria, renal colic.  ENDOCRINE:  Denies polyuria, polydipsia, heat or cold intolerance.  HEMATOLOGY:  Denies anemia, easy bruising, bleeding diathesis.  INTEGUMENTARY:  Denies acne, rash or skin lesion.  MUSCULOSKELETAL:  Denies any neck pain, back pain, arthritis, swelling or gout.  SKIN:  Denies CVA, TIA, ataxia, vertigo, tremor.  PSYCHIATRIC:  Denies anxiety, insomnia or depression.   PHYSICAL EXAMINATION: VITAL SIGNS:  Temperature 98.5, pulse 71, respiratory 20, blood pressure 127/74, saturating 96% on room air.  GENERAL:  Well-nourished male looks comfortable in bed in no apparent distress.  HEENT:  Head atraumatic, normocephalic.  Pupils equally reactive to light.  Pink conjunctivae.  Anicteric sclerae.  Moist oral mucosa.  NECK:  Supple.  No thyromegaly.  No JVD.  CHEST:  Good air entry bilaterally.  No  wheezing, rales, rhonchi.  CARDIOVASCULAR:  S1, S2 heard.  No rubs, murmur or gallops.  ABDOMEN:  Soft, nontender, nondistended.  Bowel sounds present.  EXTREMITIES:  No edema.  No clubbing.  No cyanosis.  PSYCHIATRIC:  Appropriate affect.  Awake, alert x 3.  Intact judgment and insight.  NEUROLOGIC:  Cranial nerves grossly intact.  Motor 5 out of 5.  No focal deficit.   MUSCULOSKELETAL:  No joint effusion or erythema.  LYMPHATIC:  No cervical lymphadenopathy.   PERTINENT LABORATORY DATA:  Glucose 120, BUN 12, creatinine 0.97, sodium 136, potassium 4.1, chloride 106.  Troponin 0.13.  White blood cells 11.7, hemoglobin 16.6, hematocrit 48.1, platelets 179.  EKG showing normal sinus rhythm at 74 beats per minute with no significant ST abnormalities.   ASSESSMENT AND PLAN: 1.  Non-ST-elevation myocardial infarction, the patient presents with typical cardiac chest pain even though he had negative cardiac stress test, currently have positive troponins.  He was already started on IV heparin drip.  He is already on aspirin, beta blockers.  We will check his lipid panel.  He is on as needed sublingual nitroglycerin.  He is on nitroglycerin paste.  Discussed with cardiology, Dr. Tenny Crawoss, most likely he will get cardiac catheterization by Monday morning.  2.  Hypertension:  Blood pressure seems to be acceptable.  Continue with beta blockers and Norvasc.  3.  Deep vein thrombosis prophylaxis.  The patient is on full dose anticoagulation.  4.  Gastrointestinal prophylaxis:  The patient is on proton pump inhibitor.  5.  CODE STATUS:  THE PATIENT IS FULL CODE.   Total time spent on admission and patient care 50 minutes.    ____________________________ Starleen Armsawood S. Elgergawy, MD dse:ea D: 09/26/2013 07:24:53 ET T: 09/26/2013 16:11:54 ET JOB#: [id number]  cc: Starleen Armsawood S. Elgergawy, MD, <Dictator> DAWOOD Teena IraniS ELGERGAWY MD ELECTRONICALLY SIGNED 09/28/2013 0:39

## 2014-11-30 ENCOUNTER — Other Ambulatory Visit: Payer: Self-pay | Admitting: *Deleted

## 2014-11-30 MED ORDER — AMLODIPINE BESYLATE 10 MG PO TABS: ORAL_TABLET | ORAL | Status: DC

## 2014-11-30 MED ORDER — METOPROLOL TARTRATE 25 MG PO TABS: 25.0000 mg | ORAL_TABLET | Freq: Every day | ORAL | Status: DC

## 2015-02-13 ENCOUNTER — Other Ambulatory Visit: Payer: Self-pay | Admitting: Cardiovascular Disease

## 2015-02-21 ENCOUNTER — Other Ambulatory Visit: Payer: Self-pay

## 2015-02-21 MED ORDER — OMEPRAZOLE 20 MG PO CPDR: DELAYED_RELEASE_CAPSULE | ORAL | Status: DC

## 2015-02-21 NOTE — Telephone Encounter (Signed)
Refill sent for omeprazole.  

## 2015-02-23 ENCOUNTER — Other Ambulatory Visit: Payer: Self-pay | Admitting: *Deleted

## 2015-02-23 MED ORDER — OMEPRAZOLE 20 MG PO CPDR: DELAYED_RELEASE_CAPSULE | ORAL | Status: DC

## 2015-04-29 ENCOUNTER — Other Ambulatory Visit: Payer: Self-pay | Admitting: Cardiovascular Disease

## 2015-05-25 ENCOUNTER — Other Ambulatory Visit: Payer: Self-pay | Admitting: Cardiovascular Disease

## 2015-07-21 ENCOUNTER — Other Ambulatory Visit: Payer: Self-pay | Admitting: Cardiovascular Disease

## 2015-08-18 ENCOUNTER — Ambulatory Visit (INDEPENDENT_AMBULATORY_CARE_PROVIDER_SITE_OTHER): Payer: BLUE CROSS/BLUE SHIELD | Admitting: Cardiovascular Disease

## 2015-08-18 ENCOUNTER — Encounter: Payer: Self-pay | Admitting: Cardiovascular Disease

## 2015-08-18 VITALS — BP 136/80 | HR 83 | Ht 70.0 in | Wt 198.5 lb

## 2015-08-18 DIAGNOSIS — I10 Essential (primary) hypertension: Secondary | ICD-10-CM

## 2015-08-18 DIAGNOSIS — I2510 Atherosclerotic heart disease of native coronary artery without angina pectoris: Secondary | ICD-10-CM | POA: Diagnosis not present

## 2015-08-18 DIAGNOSIS — E785 Hyperlipidemia, unspecified: Secondary | ICD-10-CM

## 2015-08-18 DIAGNOSIS — R000 Tachycardia, unspecified: Secondary | ICD-10-CM

## 2015-08-18 MED ORDER — AMLODIPINE BESYLATE 10 MG PO TABS: ORAL_TABLET | ORAL | Status: DC

## 2015-08-18 MED ORDER — OMEPRAZOLE 20 MG PO CPDR: DELAYED_RELEASE_CAPSULE | ORAL | Status: DC

## 2015-08-18 MED ORDER — METOPROLOL TARTRATE 25 MG PO TABS: 25.0000 mg | ORAL_TABLET | Freq: Every day | ORAL | Status: DC

## 2015-08-18 MED ORDER — ATORVASTATIN CALCIUM 40 MG PO TABS: 40.0000 mg | ORAL_TABLET | Freq: Every day | ORAL | Status: DC

## 2015-08-18 NOTE — Assessment & Plan Note (Signed)
Suggested he stay on his Lipitor 40 mg daily Order placed to repeat his lipid panel today

## 2015-08-18 NOTE — Progress Notes (Signed)
Patient ID: Harry [name], male    DOB: 1966/03/10, 50 y.o.   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 DES placed. He presents for routine follow-up of his coronary artery disease  In follow-up, he reports that he is doing well, denies having any angina He reports that his blood pressures running low, he is been taking amlodipine 5 mg daily Also reports he takes metoprolol 25 mg in the morning, none in the evening Stopped his Lipitor, wonders if he needs to take this Would like to stop his Effient  EKG on today's visit shows normal sinus rhythm with rate 83 bpm, no significant ST or T with abnormality    No Known Allergies  Current Outpatient Prescriptions on File Prior to Visit  Medication Sig Dispense Refill  . amLODipine (NORVASC) 10 MG tablet TAKE 1 TABLET (10 MG TOTAL) BY MOUTH DAILY. 90 tablet 3  . aspirin 81 MG tablet Take 81 mg by mouth daily.    Marland Kitchen atorvastatin (LIPITOR) 40 MG tablet Take 1 tablet (40 mg total) by mouth daily. 30 tablet 3  . atorvastatin (LIPITOR) 40 MG tablet TAKE 1 TABLET (40 MG TOTAL) BY MOUTH DAILY. 30 tablet 6  . EFFIENT 10 MG TABS tablet TAKE 1 TABLET (10 MG TOTAL) BY MOUTH DAILY.*PLS CALL SCHEDULE APPT* 30 tablet 6  . metoprolol tartrate (LOPRESSOR) 25 MG tablet Take 1 tablet (25 mg total) by mouth daily. 180 tablet 3  . Multiple Vitamin (MULTIVITAMIN) tablet Take 1 tablet by mouth daily.    . nitroGLYCERIN (NITROSTAT) 0.4 MG SL tablet Place 1 tablet (0.4 mg total) under the tongue every 5 (five) minutes as needed for chest pain. 25 tablet 6  . omeprazole (PRILOSEC) 20 MG capsule TAKE 1 CAPSULE (20 MG TOTAL) BY MOUTH 2 (TWO) TIMES  DAILY BEFORE A MEAL. 180 capsule 3   No current facility-administered medications on file prior to visit.    Past Medical History  Diagnosis Date  . Hyperlipidemia   . MI (myocardial infarction) [facility])     Past Surgical History  Procedure Laterality Date  . Mohs surgery    . Cardiac catheterization      [facility]; x1 Stent  . Coronary angioplasty with stent placement      drug eluting stent placement to the mid LAD.     Social History  reports that he has never smoked. His smokeless tobacco use includes Chew. He reports that he drinks alcohol. He reports that he does not use illicit drugs.  Family History family history includes Heart attack in his father; Hypertension in his father and mother.   Review of Systems  Constitutional: Negative.   Respiratory: Negative.   Cardiovascular: Negative.   Gastrointestinal: Negative.   Musculoskeletal: Negative.   Neurological: Negative.   Hematological: Negative.   Psychiatric/Behavioral: Negative.   All other systems reviewed and are negative.   BP 136/80 mmHg  Pulse 83  Ht  (1.778 m)  Wt 198 lb 8 oz (90.039 kg)  BMI 28.48 kg/m2  Physical Exam  Constitutional: He is oriented to person, place, and time. He appears well-developed and well-nourished.  HENT:  Head: Normocephalic.  Nose: Nose normal.  Mouth/Throat: Oropharynx is clear  and moist.  Eyes: Conjunctivae are normal. Pupils are equal, round, and reactive to light.  Neck: Normal range of motion. Neck supple. No JVD present.  Cardiovascular: Normal rate, regular rhythm, S1 normal, S2 normal, normal heart sounds and intact distal pulses.  Exam reveals no gallop and no friction rub.   No murmur heard. Pulmonary/Chest: Effort normal and breath sounds normal. No respiratory distress. He has no wheezes. He has no rales. He exhibits no tenderness.  Abdominal: Soft. Bowel sounds are normal. He exhibits no distension. There is no tenderness.  Musculoskeletal: Normal range of  motion. He exhibits no edema or tenderness.  Lymphadenopathy:    He has no cervical adenopathy.  Neurological: He is alert and oriented to person, place, and time. Coordination normal.  Skin: Skin is warm and dry. No rash noted. No erythema.  Psychiatric: He has a normal mood and affect. His behavior is normal. Judgment and thought content normal.      Assessment and Plan   Nursing note and vitals reviewed.

## 2015-08-18 NOTE — Patient Instructions (Addendum)
You are doing well. Please stay on lipitor  We will check labs couple months Call when you would like to come in for labs  Please call us if you have new issues that need to be addressed before your next appt.  Your physician wants you to follow-up in: 12 months.  You will receive a reminder letter in the mail two months in advance. If you don't receive a letter, please call our office to schedule the follow-up appointment.    Smokeless Tobacco Use Smokeless tobacco is a loose, fine, or stringy tobacco. The tobacco is not smoked like a cigarette, but it is chewed or held in the lips or cheeks. It resembles tea and comes from the leaves of the tobacco plant. Smokeless tobacco is usually flavored, sweetened, or processed in some way. Although smokeless tobacco is not smoked into the lungs, its chemicals are absorbed through the membranes in the mouth and into the bloodstream. Its chemicals are also swallowed in saliva. The chemicals (nicotine and other toxins) are known to cause cancer. Smokeless tobacco contains up to 28 differentcarcinogens. CAUSES Nicotine is addictive. Smokeless tobacco contains nicotine, which is a stimulant. This stimulant can give you a "buzz" or altered state. People can become addicted to the feeling it delivers.  SYMPTOMS Smokeless tobacco can cause health problems, including:  Bad breath.  Yellow-brown teeth.  Mouth sores.  Cracking and bleeding lips.  Gum disease, gum recession, and bone loss around the teeth.  Tooth decay.  Increased or irregular heart rate.  High blood pressure, heart disease, and stroke.  Cancer of the mouth, lips, tongue, pancreas, voice box (larynx), esophagus, colon, and bladder.  Precancerous lesion of the soft tissues of the mouth (leukoplakia).  Loss of your sense of taste. TREATMENT Talk with your caregiver about ways you can quit. Quitting tobacco is a good decision for your health. Nicotine is addictive, but several  options are available to help you quit including:  Nicotine replacement therapy (gum or patch).  Support and cessation programs. The following tips can help you quit:  Write down the reasons you would like to quit and look at them often.  Set a date during a low stress time to stop or cut back.  Ask family and friends for their support.  Remove all tobacco products from your home and work.  Replace the chewing tobacco with things like beef jerky, sunflower seeds, or shredded coconut.  Avoid situations that may make you want to chew tobacco.  Exercise and eat a healthy diet.  When you crave tobacco, distract yourself with drinking water, sugarless chewing gum, sugarless hard candy, exercising, or deep breathing. HOME CARE INSTRUCTIONS  See your dentist for regular oral health exams every 6 months.  Follow up with your caregiver as recommended. SEEK MEDICAL CARE OR DENTAL CARE IF:  You have bleeding or cracking lips, gums, or cheeks.  You have mouth sores, discolorations, or pain.  You have tooth pain.  You develop persistent irritation, burning, or sores in the mouth.  You have pain, tenderness, or numbness in the mouth.  You develop a lump, bumpy patch, or hardened skin inside the mouth.  The color changes inside your mouth (gray, white, or red spots).  You have difficulty chewing, swallowing, or speaking.   This information is not intended to replace advice given to you by your health care provider. Make sure you discuss any questions you have with your health care provider.   Document Released: 12/17/2010 Document Revised: 10/07/2011  Document Reviewed: 12/17/2010 Elsevier Interactive Patient Education [employer]

## 2015-08-18 NOTE — Assessment & Plan Note (Signed)
Denies any significant tachycardia but does have fatigue on metoprolol 25 mgrams in the morning Recommended he try 12.5 mill grams twice a day

## 2015-08-18 NOTE — Assessment & Plan Note (Signed)
Recommended he stay on amlodipine 5 mill grams daily, metoprolol 12.5 mg twice a day

## 2015-08-18 NOTE — Assessment & Plan Note (Signed)
Currently with no symptoms of angina. No further workup at this time. Continue current medication regimen. 

## 2015-11-14 IMAGING — CR DG CHEST 1V PORT
1 series · 1 of 1 positions shown · non-contrast
Comparison: None.

CLINICAL DATA: Chest pain for 2 weeks.

EXAM:
PORTABLE CHEST - 1 VIEW

[ap]
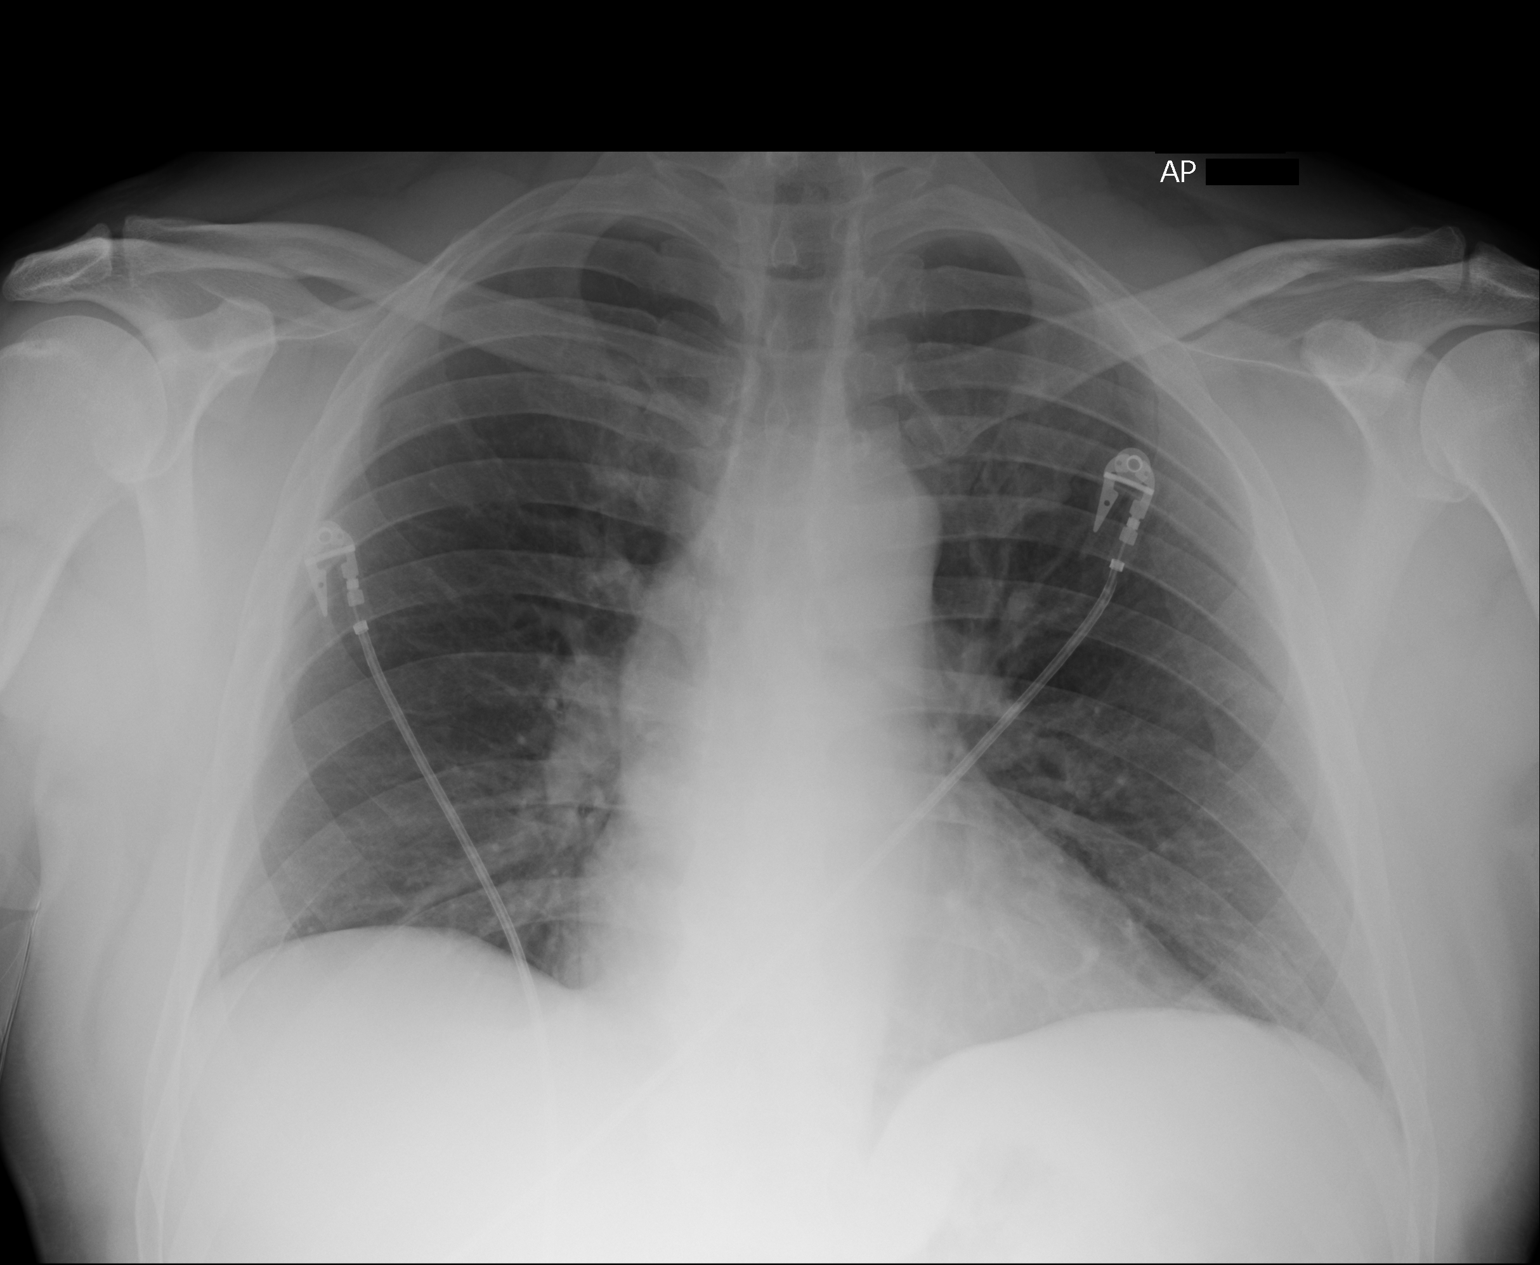

[1 of 1 positions shown; findings below may reference images not displayed]

FINDINGS: The lungs are well-aerated and clear. There is no evidence of focal
opacification, pleural effusion or pneumothorax.

The cardiomediastinal silhouette is within normal limits. No acute
osseous abnormalities are seen.
IMPRESSION: No acute cardiopulmonary process seen.

## 2015-12-14 ENCOUNTER — Encounter: Payer: Self-pay | Admitting: *Deleted

## 2015-12-14 NOTE — Progress Notes (Signed)
This encounter was created in error - please disregard.

## 2016-09-20 ENCOUNTER — Other Ambulatory Visit: Payer: Self-pay | Admitting: Cardiovascular Disease

## 2016-11-30 NOTE — Progress Notes (Signed)
Cardiology Office Note  Date:  12/02/2016   ID:  Harry [name], DOB 1965-10-02, MRN [id number]  PCP:  Patient, [name] Per   Chief Complaint  Patient presents with  . other    12 month follow up. Patient states he is doing well.  Meds reviewed verbally with patient.     HPI:   Mr. 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  DES placed. 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  PSH:    Past Surgical History:  Procedure Laterality Date  . CARDIAC CATHETERIZATION     [facility]; x1 Stent  . CORONARY ANGIOPLASTY WITH STENT PLACEMENT     drug eluting stent placement to the mid LAD.   Marland Kitchen. MOHS SURGERY      Current Outpatient Prescriptions  Medication Sig Dispense Refill  . amLODipine (NORVASC) 10 MG tablet TAKE 1 TABLET DAILY 90 tablet 3  . aspirin 81 MG tablet Take 81 mg by mouth daily.    Marland Kitchen. atorvastatin (LIPITOR) 40 MG tablet TAKE 1 TABLET DAILY 90 tablet 4  . metoprolol tartrate (LOPRESSOR) 25 MG tablet TAKE 1 TABLET DAILY 180 tablet 3  . Multiple Vitamin (MULTIVITAMIN) tablet Take 1 tablet by mouth daily.    . nitroGLYCERIN (NITROSTAT) 0.4 MG SL tablet Place 1 tablet (0.4 mg total) under the tongue every 5 (five) minutes as needed for chest pain. 25 tablet 6  . omeprazole (PRILOSEC) 20 MG capsule TAKE 1  CAPSULE TWICE A DAY BEFORE MEALS 180 capsule 3   No current facility-administered medications for this visit.      Allergies:   Patient has no known allergies.   Social History:  The patient  reports that he has never smoked. His smokeless tobacco use includes Chew. He reports that he drinks alcohol. He reports that he does not use drugs.   Family History:   family history includes Heart attack in his father; Hypertension in his father and mother.    Review of Systems: Review of Systems  Constitutional: Negative.   Respiratory: Negative.   Cardiovascular: Negative.   Gastrointestinal: Negative.   Musculoskeletal: Negative.   Neurological: Negative.   Psychiatric/Behavioral: Negative.   All other systems reviewed and are negative.    PHYSICAL EXAM: VS:  BP 136/80 (BP Location: Left Arm, Patient Position: Sitting, Cuff Size: Normal)   Pulse (!) 51   Ht 5\' 10"  (1.778 m)   Wt 198 lb 4 oz (89.9 kg)   BMI 28.45 kg/m  , BMI Body mass index is 28.45 kg/m. GEN: Well nourished, well developed, in no acute distress  HEENT: normal  Neck: no JVD, carotid bruits, or masses Cardiac: RRR; no murmurs, rubs, or gallops,no edema  Respiratory:  clear to auscultation bilaterally, normal work of breathing GI: soft, nontender, nondistended, + BS MS: no deformity or atrophy  Skin: warm and dry, no rash Neuro:  Strength and sensation are intact Psych: euthymic mood, full affect    Recent Labs: No results found for requested labs within last 8760 hours.    Lipid Panel Lab Results  Component Value Date   CHOL 133 01/07/2014   HDL 34 (L) 01/07/2014   LDLCALC 61 01/07/2014   TRIG 191 (H) 01/07/2014      Wt Readings from Last 3 Encounters:  12/02/16 198 lb 4 oz (89.9 kg)  08/18/15 198 lb 8 oz (90 kg)  05/04/14 194 lb 8 oz (88.2 kg)       ASSESSMENT AND PLAN:  Coronary artery disease of native artery of native heart with stable angina pectoris (HCC) - Plan: EKG  12-Lead Currently with no symptoms of angina. No further workup at this time. Continue current medication regimen.  Mixed hyperlipidemia - Plan: EKG 12-Lead He will obtain lab work from work Discussed cholesterol goal with him, total cholesterol less than 150, LDL less than 70  Essential hypertension - Plan: EKG 12-Lead Blood pressure is well controlled on today's visit. No changes made to the medications.   Total encounter time more than 25 minutes  Greater than 50% was spent in counseling and coordination of care with the patient   Disposition:   F/U  12 months   Orders Placed This Encounter  Procedures  . EKG 12-Lead     Signed, Dossie Arbour, M.D., Ph.D. 12/02/2016  [facility] [place], [place] [phone]

## 2016-12-02 ENCOUNTER — Encounter: Payer: Self-pay | Admitting: Cardiovascular Disease

## 2016-12-02 ENCOUNTER — Ambulatory Visit (INDEPENDENT_AMBULATORY_CARE_PROVIDER_SITE_OTHER): Payer: BLUE CROSS/BLUE SHIELD | Admitting: Cardiovascular Disease

## 2016-12-02 VITALS — BP 136/80 | HR 51 | Ht 70.0 in | Wt 198.2 lb

## 2016-12-02 DIAGNOSIS — I25118 Atherosclerotic heart disease of native coronary artery with other forms of angina pectoris: Secondary | ICD-10-CM | POA: Diagnosis not present

## 2016-12-02 DIAGNOSIS — E782 Mixed hyperlipidemia: Secondary | ICD-10-CM

## 2016-12-02 DIAGNOSIS — I10 Essential (primary) hypertension: Secondary | ICD-10-CM

## 2016-12-02 NOTE — Patient Instructions (Signed)
Medication Instructions:   Try metoprolol 1/2 pill twice a day or full 25 mg at night  Goal total chol <150 LDL <70  If it runs high, we can add zetia one a day (vytorin, combo pill)  Labwork:  No new labs needed  Testing/Procedures:  No further testing at this time   I recommend watching educational videos on topics of interest to you at:       [email or web address]  Enter code: HEARTCARE    Follow-Up: It was a pleasure seeing you in the office today. Please call us if you have new issues that need to be addressed before your next appt.  [phone]  Your physician wants you to follow-up in: 12 months.  You will receive a reminder letter in the mail two months in advance. If you don't receive a letter, please call our office to schedule the follow-up appointment.  If you need a refill on your cardiac medications before your next appointment, please call your pharmacy.

## 2017-08-15 ENCOUNTER — Other Ambulatory Visit: Payer: Self-pay | Admitting: Cardiovascular Disease

## 2017-11-21 ENCOUNTER — Other Ambulatory Visit: Payer: Self-pay | Admitting: Cardiovascular Disease

## 2017-11-21 ENCOUNTER — Telehealth: Payer: Self-pay | Admitting: Cardiovascular Disease

## 2017-11-21 NOTE — Telephone Encounter (Signed)
-----   Message from Margrett RudBrittany N Slayton, [place] sent at 11/21/2017 10:57 AM EDT ----- Regarding: Patient needs an appointment  Can you please try to schedule a 12 month follow up with Dr. Mariah MillingGollan.  Thank you!

## 2017-11-21 NOTE — Telephone Encounter (Signed)
Scheduled 6/4

## 2017-12-28 NOTE — Progress Notes (Signed)
Cardiology Office Note  Date:  12/30/2017   ID:  Harry AbedChristophe A [name], DOB 10/31/1965, MRN [id number]  PCP:  Patient, [name] Per   Chief Complaint  Patient presents with  . Other    12 month follow up. Patient c/o leg swelling and think it is coming from a medication.  Patient denies chest pain and SOB. Meds reviewed verbally with patient.     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 09/27/2013 DES placed. 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  PSH:    Past Surgical History:  Procedure Laterality Date  . CARDIAC CATHETERIZATION     [facility]; x1 Stent  . CORONARY ANGIOPLASTY WITH STENT PLACEMENT     drug eluting stent placement to the mid LAD.   Marland Kitchen. MOHS SURGERY      Current Outpatient Medications  Medication Sig Dispense Refill  . aspirin 81 MG tablet Take 81 mg by mouth daily.    Marland Kitchen. atorvastatin (LIPITOR) 40 MG tablet Take 1 tablet (40 mg total) by mouth daily. 90 tablet 4  . Multiple Vitamin (MULTIVITAMIN) tablet Take 1 tablet by mouth daily.    . nitroGLYCERIN (NITROSTAT) 0.4 MG SL tablet Place 1 tablet (0.4 mg total) under the tongue every 5 (five) minutes as needed for chest  pain. 25 tablet 6  . omeprazole (PRILOSEC) 20 MG capsule TAKE 1 CAPSULE TWICE A DAY BEFORE MEALS 180 capsule 3  . losartan (COZAAR) 100 MG tablet Take 1 tablet (100 mg total) by mouth daily. 90 tablet 3  . metoprolol succinate (TOPROL XL) 25 MG 24 hr tablet Take 1 tablet (25 mg total) by mouth daily. 90 tablet 3   No current facility-administered medications for this visit.      Allergies:   Patient has no known allergies.   Social History:  The patient  reports that he has never smoked. His smokeless tobacco use includes chew. He reports that he drinks alcohol. He reports that he does not use drugs.   Family History:   family history includes CAD in his unknown relative; Heart attack in his father; Hypertension in his father and mother.    Review of Systems: Review of Systems  Constitutional: Negative.   Respiratory: Negative.   Cardiovascular: Negative.   Gastrointestinal: Negative.   Musculoskeletal: Negative.   Neurological: Negative.   Psychiatric/Behavioral: Negative.   All other systems reviewed and are negative.    PHYSICAL EXAM: VS:  BP 136/90 (BP Location: Left Arm, Patient Position: Sitting, Cuff Size: Normal)   Pulse 68   Ht 5\' 10"  (1.778 m)   Wt 199 lb 4 oz (90.4 kg)   BMI 28.59 kg/m  ,  BMI Body mass index is 28.59 kg/m. Constitutional:  oriented to person, place, and time. No distress.  HENT:  Head: Normocephalic and atraumatic.  Eyes:  no discharge. No scleral icterus.  Neck: Normal range of motion. Neck supple. No JVD present.  Cardiovascular: Normal rate, regular rhythm, normal heart sounds and intact distal pulses. Exam reveals no gallop and no friction rub. No edema No murmur heard. Pulmonary/Chest: Effort normal and breath sounds normal. No stridor. No respiratory distress.  no wheezes.  no rales.  no tenderness.  Abdominal: Soft.  no distension.  no tenderness.  Musculoskeletal: Normal range of motion.  no  tenderness or deformity.  Neurological:   normal muscle tone. Coordination normal. No atrophy Skin: Skin is warm and dry. No rash noted. not diaphoretic.  Psychiatric:  normal mood and affect. behavior is normal. Thought content normal.    Recent Labs: No results found for requested labs within last 8760 hours.    Lipid Panel Lab Results  Component Value Date   CHOL 133 01/07/2014   HDL 34 (L) 01/07/2014   LDLCALC 61 01/07/2014   TRIG 191 (H) 01/07/2014      Wt Readings from Last 3 Encounters:  12/30/17 199 lb 4 oz (90.4 kg)  12/02/16 198 lb 4 oz (89.9 kg)  08/18/15 198 lb 8 oz (90 kg)       ASSESSMENT AND PLAN:  Coronary artery disease of native artery of native heart with stable angina pectoris (HCC) - Plan: EKG 12-Lead Currently with no symptoms of angina. No further workup at this time. Continue current medication regimen. Stable he will send [place] lab work from work  Mixed hyperlipidemia - Plan: EKG 12-Lead He will fax [place] the lab work that he has done through work goal total cholesterol less than 150, LDL less than 70 Medication has been refilled pending his lab work coming in  Essential hypertension - Plan: EKG 12-Lead He has lowerxtremity swelling minimal on exam but reports having symptoms We'll hold the amlodipine and changed to losartan 100 mg daily We'll change the metoprolol tartrate to metoprolol succinate He can try 25 mg daily but may need half pill   Total encounter time more than 15 minutes  Greater than 50% was spent in counseling and coordination of care with the patient   Disposition:   F/U  12 months   Orders Placed This Encounter  Procedures  . EKG 12-Lead     Signed, Dossie Arbour, M.D., Ph.D. 12/30/2017  [facility] [place], [place] [phone]

## 2017-12-30 ENCOUNTER — Encounter: Payer: Self-pay | Admitting: Cardiovascular Disease

## 2017-12-30 ENCOUNTER — Ambulatory Visit (INDEPENDENT_AMBULATORY_CARE_PROVIDER_SITE_OTHER): Payer: BLUE CROSS/BLUE SHIELD | Admitting: Cardiovascular Disease

## 2017-12-30 VITALS — BP 136/90 | HR 68 | Ht 70.0 in | Wt 199.2 lb

## 2017-12-30 DIAGNOSIS — I10 Essential (primary) hypertension: Secondary | ICD-10-CM

## 2017-12-30 DIAGNOSIS — E782 Mixed hyperlipidemia: Secondary | ICD-10-CM | POA: Diagnosis not present

## 2017-12-30 DIAGNOSIS — I25118 Atherosclerotic heart disease of native coronary artery with other forms of angina pectoris: Secondary | ICD-10-CM | POA: Diagnosis not present

## 2017-12-30 MED ORDER — LOSARTAN POTASSIUM 100 MG PO TABS: 100.0000 mg | ORAL_TABLET | Freq: Every day | ORAL | 3 refills | Status: DC

## 2017-12-30 MED ORDER — METOPROLOL SUCCINATE ER 25 MG PO TB24: 25.0000 mg | ORAL_TABLET | Freq: Every day | ORAL | 3 refills | Status: DC

## 2017-12-30 MED ORDER — ATORVASTATIN CALCIUM 40 MG PO TABS: 40.0000 mg | ORAL_TABLET | Freq: Every day | ORAL | 4 refills | Status: DC

## 2017-12-30 NOTE — Patient Instructions (Addendum)
Medication Instructions:   Please hold amlodipine, Start losartan one a day  Hold the metoprolol tartarte Start metoprolol succinate 1/2 pill  Labwork:  No new labs needed  Testing/Procedures:  No further testing at this time   Follow-Up: It was a pleasure seeing you in the office today. Please call us if you have new issues that need to be addressed before your next appt.  [phone]  Your physician wants you to follow-up in: 12 months.  You will receive a reminder letter in the mail two months in advance. If you don't receive a letter, please call our office to schedule the follow-up appointment.  If you need a refill on your cardiac medications before your next appointment, please call your pharmacy.  For educational health videos Log in to : [email or web address] Or : [email or web address], password : [email or web address]

## 2018-01-20 ENCOUNTER — Ambulatory Visit: Payer: BLUE CROSS/BLUE SHIELD | Admitting: Nurse Practitioner

## 2018-12-22 ENCOUNTER — Other Ambulatory Visit: Payer: Self-pay | Admitting: Cardiovascular Disease

## 2019-03-19 ENCOUNTER — Other Ambulatory Visit: Payer: Self-pay | Admitting: Cardiovascular Disease

## 2019-03-22 NOTE — Telephone Encounter (Signed)
Please schedule overdue F/U with Dr. Gollan. Thank you! 

## 2019-03-23 NOTE — Telephone Encounter (Signed)
Patient scheduled 10/6 with Rockey Situ

## 2019-05-03 NOTE — Progress Notes (Signed)
Cardiology Office Note  Date:  05/04/2019   ID:  DURANTE VIOLETT, DOB [birth year], MRN [id number]  PCP:  Patient, [name] Per   Chief Complaint  Patient presents with  . other    12 month follow up. Meds reviewed by the pt. verbally. "doing well."     HPI:  Mr. Kirker is a very pleasant 53 year old gentleman,  nonsmoker, no diabetes,  Medic, lives on a farm,  Hx of chest pain,  CAD, catheterization showing severe proximal LAD disease. 09/27/2013 DES placed. He presents for routine follow-up of his coronary artery disease  Overall reports he is doing well Continues to work at [employer] Also runs a farm [employer] Lipitor 40 mg daily Labwork done through work,  He will fax [place] a copy in the next month Very active at baseline, he denies any anginal symptoms Raising bees  Previous had leg swelling on amlodipine, medication was held and symptoms resolved Tolerating losartan and metoprolol  EKG personally reviewed by myself on todays visit Shows sinus bradycardia rate 77 bpm no significant ST or T-wave changes   PMH:   has a past medical history of Hyperlipidemia and MI (myocardial infarction) (HCC).  PSH:    Past Surgical History:  Procedure Laterality Date  . CARDIAC CATHETERIZATION     [facility]; x1 Stent  . CORONARY ANGIOPLASTY WITH STENT PLACEMENT     drug eluting stent placement to the mid LAD.   Marland Kitchen MOHS SURGERY      Current Outpatient Medications  Medication Sig Dispense Refill  . aspirin 81 MG tablet Take 81 mg by mouth daily.    Marland Kitchen atorvastatin (LIPITOR) 40 MG tablet TAKE 1 TABLET DAILY 90 tablet 0  . losartan (COZAAR) 100 MG tablet TAKE 1 TABLET DAILY 90 tablet 0  . metoprolol succinate (TOPROL-XL) 25 MG 24 hr tablet TAKE 1 TABLET DAILY 90 tablet 0  . Multiple Vitamin (MULTIVITAMIN) tablet Take 1 tablet by mouth daily.    . nitroGLYCERIN (NITROSTAT) 0.4 MG SL tablet Place 1 tablet (0.4 mg total) under the tongue every 5 (five) minutes as needed for chest pain. 25  tablet 6  . omeprazole (PRILOSEC) 20 MG capsule Take 1 capsule (20 mg total) by mouth daily. 90 capsule 0   No current facility-administered medications for this visit.      Allergies:   Patient has no known allergies.   Social History:  The patient  reports that he has never smoked. His smokeless tobacco use includes chew. He reports current alcohol use. He reports that he does not use drugs.   Family History:   family history includes CAD in an other family member; Heart attack in his father; Hypertension in his father and mother.    Review of Systems: Review of Systems  Constitutional: Negative.   Respiratory: Negative.   Cardiovascular: Negative.   Gastrointestinal: Negative.   Musculoskeletal: Negative.   Neurological: Negative.   Psychiatric/Behavioral: Negative.   All other systems reviewed and are negative.   PHYSICAL EXAM: VS:  BP 140/85 (BP Location: Left Arm, Patient Position: Sitting, Cuff Size: Normal)   Pulse 77   Temp 97.7 F (36.5 C)   Ht 5\' 9"  (1.753 m)   Wt 197 lb (89.4 kg)   BMI 29.09 kg/m  , BMI Body mass index is 29.09 kg/m. Constitutional:  oriented to person, place, and time. No distress.  HENT:  Head: Grossly normal Eyes:  no discharge. No scleral icterus.  Neck: No JVD, no carotid bruits  Cardiovascular:  Regular rate and rhythm, no murmurs appreciated Pulmonary/Chest: Clear to auscultation bilaterally, no wheezes or rails Abdominal: Soft.  no distension.  no tenderness.  Musculoskeletal: Normal range of motion Neurological:  normal muscle tone. Coordination normal. No atrophy Skin: Skin warm and dry Psychiatric: normal affect, pleasant   Recent Labs: No results found for requested labs within last 8760 hours.    Lipid Panel Lab Results  Component Value Date   CHOL 133 01/07/2014   HDL 34 (L) 01/07/2014   LDLCALC 61 01/07/2014   TRIG 191 (H) 01/07/2014      Wt Readings from Last 3 Encounters:  05/04/19 197 lb (89.4 kg)   12/30/17 199 lb 4 oz (90.4 kg)  12/02/16 198 lb 4 oz (89.9 kg)       ASSESSMENT AND PLAN:  Coronary artery disease of native artery of native heart with stable angina pectoris ([place]) - Plan: EKG 12-Lead No anginal symptoms, no further testing  Mixed hyperlipidemia - Plan: EKG 12-Lead Commend he fax [place] his latest lab work goal LDL less than 70  Essential hypertension - Plan: EKG 12-Lead Blood pressure is well controlled on today's visit. No changes made to the medications.    Total encounter time more than 15 minutes  Greater than 50% was spent in counseling and coordination of care with the patient   Disposition:   F/U  12 months   No orders of the defined types were placed in this encounter.    Signed, Esmond Plants, M.D., Ph.D. 05/04/2019  [place], [place]

## 2019-05-04 ENCOUNTER — Encounter: Payer: Self-pay | Admitting: Cardiovascular Disease

## 2019-05-04 ENCOUNTER — Other Ambulatory Visit: Payer: Self-pay

## 2019-05-04 ENCOUNTER — Ambulatory Visit (INDEPENDENT_AMBULATORY_CARE_PROVIDER_SITE_OTHER): Payer: BC Managed Care – PPO | Admitting: Cardiovascular Disease

## 2019-05-04 VITALS — BP 140/85 | HR 77 | Temp 97.7°F | Ht 69.0 in | Wt 197.0 lb

## 2019-05-04 DIAGNOSIS — I25118 Atherosclerotic heart disease of native coronary artery with other forms of angina pectoris: Secondary | ICD-10-CM | POA: Diagnosis not present

## 2019-05-04 DIAGNOSIS — E782 Mixed hyperlipidemia: Secondary | ICD-10-CM

## 2019-05-04 DIAGNOSIS — I10 Essential (primary) hypertension: Secondary | ICD-10-CM | POA: Diagnosis not present

## 2019-05-04 MED ORDER — ATORVASTATIN CALCIUM 40 MG PO TABS: 40.0000 mg | ORAL_TABLET | Freq: Every day | ORAL | 3 refills | Status: DC

## 2019-05-04 MED ORDER — OMEPRAZOLE 20 MG PO CPDR: 20.0000 mg | DELAYED_RELEASE_CAPSULE | Freq: Every day | ORAL | 3 refills | Status: DC

## 2019-05-04 MED ORDER — LOSARTAN POTASSIUM 100 MG PO TABS: 100.0000 mg | ORAL_TABLET | Freq: Every day | ORAL | 3 refills | Status: DC

## 2019-05-04 MED ORDER — METOPROLOL SUCCINATE ER 25 MG PO TB24: 25.0000 mg | ORAL_TABLET | Freq: Every day | ORAL | 3 refills | Status: DC

## 2019-05-04 NOTE — Patient Instructions (Signed)

## 2019-05-04 NOTE — Addendum Note (Signed)
Addended by: Lamar Laundry on: 05/04/2019 08:43 AM   Modules accepted: Orders

## 2020-02-09 ENCOUNTER — Telehealth: Payer: Self-pay | Admitting: Cardiovascular Disease

## 2020-02-09 NOTE — Telephone Encounter (Signed)
   Primary Cardiologist: Dr Mariah Milling  Chart reviewed as part of pre-operative protocol coverage. Given past medical history and time since last visit, based on ACC/AHA guidelines, Kimber A Waddell would be at acceptable risk for the planned procedure without further cardiovascular testing.   OK to hold aspirin 3-5 days pre op if needed.  I will route this recommendation to the requesting party via Epic fax function and remove from pre-op pool.  Please call with questions.  Corine Shelter, PA-C 02/09/2020, 3:03 PM

## 2020-02-09 NOTE — Telephone Encounter (Signed)
Dr Mariah Milling just an FYI- We were contacted for pre op clearance and recommendations on holding aspirin in this patient. He has had PCI in 2015 and has done well from a cardiac standpoint since.  Since he hasn't been seen in > 6 months usually we would recommend seeing the patient in the office but that would most likely not be possible before his surgery date 7/21 and his surgery does sound urgent (he has torn several muscles in his shoulder). The patient assured me he has been doing well from a cardiac standpoint, no chest pain or unusual SOB. I am going to clear him with recommendations to hold aspirin and Plavix 3-5 days pre op.  He will see you for his annual in Sept-Oct.   Corine Shelter PA-C 02/09/2020 2:59 PM

## 2020-02-09 NOTE — Telephone Encounter (Signed)
° °  [facility] Pre-operative Risk Assessment    HEARTCARE STAFF: - Please ensure there is not already an duplicate clearance open for this procedure. - Under Visit Info/Reason for Call, type in Other and utilize the format Clearance MM/DD/YY or Clearance TBD. Do not use dashes or single digits. - If request is for dental extraction, please clarify the # of teeth to be extracted.  Request for surgical clearance:  1. What type of surgery is being performed? Rt Shoulder Scope    2. When is this surgery scheduled? 02/16/20   3. What type of clearance is required (medical clearance vs. Pharmacy clearance to hold med vs. Both)? Unclear request is "schedule for procedure" and notes are requested (sent)   4. Are there any medications that need to be held prior to surgery and how long? Not noted   5. Practice name and name of physician performing surgery?  [facility]    6. What is the office phone number?  [phone]    7.   What is the office fax number? [phone]  8.   Anesthesia type (None, local, MAC, general) ? MAC interscalene   Clarisse Gouge 02/09/2020, 1:55 PM  _________________________________________________________________   (provider comments below)

## 2020-02-13 NOTE — Telephone Encounter (Signed)
Clear for surgery, would prefer he stay on low-dose aspirin if surgery will permit

## 2020-02-14 NOTE — Telephone Encounter (Signed)
Spoke with patient and reviewed that clearance was provided and recommendations by Dr. Mariah Milling. He states that they did request he hold aspirin for 3 days prior. He also wants to schedule follow up once he has recovered some from his surgery. Instructed him to please call when he is ready to schedule and he verbalized understanding with no further questions at this time.

## 2020-02-14 NOTE — Telephone Encounter (Signed)
Spoke with patient and reviewed that I did get his message about resuming the aspirin. Wished him the best with his surgery and a speedy recovery. He was appreciative for the call back with no further questions at this time.

## 2020-02-14 NOTE — Telephone Encounter (Signed)
Patient calling back in stating he stopped his aspirin (81 mg) yesterday but now the surgeons office called and told pt to resume medication which he will do today.

## 2020-06-06 NOTE — Progress Notes (Signed)
Office Visit    Patient Name: Harry [name] Date of Encounter: 06/07/2020  Primary Care Provider:  Patient, [name] Per Primary Cardiologist:  Julien Nordmann, MD Electrophysiologist:  None   Chief Complaint    Harry [name] is a 54 y.o. male with a hx of CAD, HLD, HTN, GERD presents today for follow up of CAD.   Past Medical History    Past Medical History:  Diagnosis Date  . Hyperlipidemia   . MI (myocardial infarction) [facility])    Past Surgical History:  Procedure Laterality Date  . CARDIAC CATHETERIZATION     [facility]; x1 Stent  . CORONARY ANGIOPLASTY WITH STENT PLACEMENT     drug eluting stent placement to the mid LAD.   Marland Kitchen MOHS SURGERY    . ROTATOR CUFF REPAIR Right   . SHOULDER SURGERY Right     Allergies  No Known Allergies  History of Present Illness    Harry [name] is a 53 y.o. male with a hx of CAD, HTN, HLD, GERD last seen 05/04/19 by Dr. Mariah Milling.  He had cardiac catheterization 09/2013 with severe proximal LAD disease and LAD was placed. He previously had swelling on Amlodipine and it was discontinued.   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  He enjoys spending time with his 47 year old daughter.  He reports feeling overall well.  No formal exercise routine but he does stay very active at work and at home.  He endorses eating a low-sodium, heart healthy diet though does occasionally indulge.  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  We discussed goals for his cholesterol counts.  Reports no shortness of breath nor dyspnea on exertion. Reports no chest pain, pressure, or tightness. No edema, orthopnea, PND. Reports no palpitations.  EKGs/Labs/Other Studies Reviewed:   The following studies were reviewed today:  EKG:  EKG is ordered today.  The ekg ordered today demonstrates NSR 96 bpm with no acute ST/T wave changes.  Recent Labs: No results found for requested labs within last 8760 hours.  Recent Lipid  Panel    Component Value Date/Time   CHOL 133 01/07/2014 0834   CHOL 185 09/26/2013 0508   TRIG 191 (H) 01/07/2014 0834   TRIG 712 (H) 09/26/2013 0508   HDL 34 (L) 01/07/2014 0834   HDL 24 (L) 09/26/2013 0508   CHOLHDL 3.9 01/07/2014 0834   VLDL SEE COMMENT 09/26/2013 0508   LDLCALC 61 01/07/2014 0834   LDLCALC SEE COMMENT 09/26/2013 0508   Home Medications   Current Meds  Medication Sig  . aspirin 81 MG tablet Take 81 mg by mouth daily.  Marland Kitchen atorvastatin (LIPITOR) 40 MG tablet Take 1 tablet (40 mg total) by mouth daily.  Marland Kitchen losartan (COZAAR) 100 MG tablet Take 1 tablet (100 mg total) by mouth daily.  . metoprolol succinate (TOPROL-XL) 25 MG 24 hr tablet Take 1 tablet (25 mg total) by mouth daily.  . Multiple Vitamin (MULTIVITAMIN) tablet Take 1 tablet by mouth daily.  . nitroGLYCERIN (NITROSTAT) 0.4 MG SL tablet Place 1 tablet (0.4 mg total) under the tongue every 5 (five) minutes as needed for chest pain.  Marland Kitchen omeprazole (PRILOSEC) 20 MG capsule Take 1 capsule (20 mg total) by mouth daily.  . [DISCONTINUED] atorvastatin (LIPITOR) 40 MG tablet Take 1 tablet (40 mg total) by mouth daily.  . [DISCONTINUED] losartan (COZAAR) 100 MG tablet Take 1 tablet (100 mg total) by mouth daily.  . [  DISCONTINUED] metoprolol succinate (TOPROL-XL) 25 MG 24 hr tablet Take 1 tablet (25 mg total) by mouth daily.  . [DISCONTINUED] nitroGLYCERIN (NITROSTAT) 0.4 MG SL tablet Place 1 tablet (0.4 mg total) under the tongue every 5 (five) minutes as needed for chest pain.  . [DISCONTINUED] omeprazole (PRILOSEC) 20 MG capsule Take 1 capsule (20 mg total) by mouth daily.     Review of Systems  All other systems reviewed and are otherwise negative except as noted above.  Physical Exam    VS:  BP 130/80 (BP Location: Left Arm, Patient Position: Sitting, Cuff Size: Normal)   Pulse 96   Ht 5\' 10"  (1.778 m)   Wt 196 lb (88.9 kg)   SpO2 98%   BMI 28.12 kg/m  , BMI Body mass index is 28.12 kg/m.  Wt Readings  from Last 3 Encounters:  06/07/20 196 lb (88.9 kg)  05/04/19 197 lb (89.4 kg)  12/30/17 199 lb 4 oz (90.4 kg)    GEN: Well nourished, well developed, in no acute distress. HEENT: normal. Neck: Supple, no JVD, carotid bruits, or masses. Cardiac: RRR, no murmurs, rubs, or gallops. No clubbing, cyanosis, edema.  Radials//PT 2+ and equal bilaterally.  Respiratory:  Respirations regular and unlabored, clear to auscultation bilaterally. GI: Soft, nontender, nondistended. MS: No deformity or atrophy. Skin: Warm and dry, no rash. Neuro:  Strength and sensation are intact. Psych: Normal affect.  Assessment & Plan    1. CAD - Stable with no anginal symptoms. No indication for ischemic evaluation at this time. GDMT includes Aspirin, Metoprolol, Atorvastatin.  Refills provided.  Low-sodium, heart healthy diet and regular cardiovascular exercise encouraged.  2. HTN - BP well controlled. Continue current antihypertensive regimen.  Including losartan 100 mg daily, Toprol 25 mg daily.  Refills provided.  3. HLD -plans to have lipid panel collected at his work.  Discussed LDL goal less than 70 and triglyceride goal less than 150.  Information on lipid-lowering diet provided on his after visit summary.  Continue atorvastatin 40 mg daily, refill provided.  If LDL not at goal less than 70 consider increased dose.  4. GERD -well-controlled on omeprazole.  Refill provided.  Disposition: Follow up in 1 year(s) with Dr. 03/01/18 or APP   Signed, Mariah Milling, NP 06/07/2020, 10:59 AM [facility]

## 2020-06-07 ENCOUNTER — Encounter: Payer: Self-pay | Admitting: Family

## 2020-06-07 ENCOUNTER — Ambulatory Visit (INDEPENDENT_AMBULATORY_CARE_PROVIDER_SITE_OTHER): Payer: BC Managed Care – PPO | Admitting: Family

## 2020-06-07 ENCOUNTER — Other Ambulatory Visit: Payer: Self-pay

## 2020-06-07 VITALS — BP 130/80 | HR 96 | Ht 70.0 in | Wt 196.0 lb

## 2020-06-07 DIAGNOSIS — K219 Gastro-esophageal reflux disease without esophagitis: Secondary | ICD-10-CM

## 2020-06-07 DIAGNOSIS — E785 Hyperlipidemia, unspecified: Secondary | ICD-10-CM

## 2020-06-07 DIAGNOSIS — I25118 Atherosclerotic heart disease of native coronary artery with other forms of angina pectoris: Secondary | ICD-10-CM | POA: Diagnosis not present

## 2020-06-07 DIAGNOSIS — I10 Essential (primary) hypertension: Secondary | ICD-10-CM

## 2020-06-07 MED ORDER — METOPROLOL SUCCINATE ER 25 MG PO TB24: 25.0000 mg | ORAL_TABLET | Freq: Every day | ORAL | 3 refills | Status: DC

## 2020-06-07 MED ORDER — NITROGLYCERIN 0.4 MG SL SUBL: 0.4000 mg | SUBLINGUAL_TABLET | SUBLINGUAL | 6 refills | Status: DC | PRN

## 2020-06-07 MED ORDER — ATORVASTATIN CALCIUM 40 MG PO TABS: 40.0000 mg | ORAL_TABLET | Freq: Every day | ORAL | 3 refills | Status: DC

## 2020-06-07 MED ORDER — LOSARTAN POTASSIUM 100 MG PO TABS: 100.0000 mg | ORAL_TABLET | Freq: Every day | ORAL | 3 refills | Status: DC

## 2020-06-07 MED ORDER — OMEPRAZOLE 20 MG PO CPDR: 20.0000 mg | DELAYED_RELEASE_CAPSULE | Freq: Every day | ORAL | 3 refills | Status: DC

## 2020-06-07 NOTE — Patient Instructions (Addendum)
Medication Instructions:  No medication changes today.   *If you need a refill on your cardiac medications before your next appointment, please call your pharmacy*  Lab Work: No lab work today.   Have your cholesterol and CMP checked with your job. Our goal is for your LDL to be less than 70, triglycerides less than 150.   Testing/Procedures: Your EKG today show normal sinus rhythm.    Follow-Up: At [facility], you and your health needs are our priority.  As part of our continuing mission to provide you with exceptional heart care, we have created designated Provider Care Teams.  These Care Teams include your primary Cardiologist (physician) and Advanced Practice Providers (APPs -  Physician Assistants and Nurse Practitioners) who all work together to provide you with the care you need, when you need it.  We recommend signing up for the patient portal called "MyChart".  Sign up information is provided on this After Visit Summary.  MyChart is used to connect with patients for Virtual Visits (Telemedicine).  Patients are able to view lab/test results, encounter notes, upcoming appointments, etc.  Non-urgent messages can be sent to your provider as well.   To learn more about what you can do with MyChart, go to [email or web address].    Your next appointment:   1 year(s)  The format for your next appointment:   In Person  Provider:   You may see Julien Nordmann, MD or one of the following Advanced Practice Providers on your designated Care Team:    Nicolasa Ducking, NP  Eula Listen, PA-C  Marisue Ivan, PA-C  Cadence Fransico Michael, [place]  Other Instructions:    Fat and Cholesterol Restricted Eating Plan Getting too much fat and cholesterol in your diet may cause health problems. Choosing the right foods helps keep your fat and cholesterol at normal levels. This can keep you from getting certain diseases.  What are tips for following this plan? Meal planning  At meals, divide  your plate into four equal parts: ? Fill one-half of your plate with vegetables and green salads. ? Fill one-fourth of your plate with whole grains. ? Fill one-fourth of your plate with low-fat (lean) protein foods.  Eat fish that is high in omega-3 fats at least two times a week. This includes mackerel, tuna, sardines, and salmon.  Eat foods that are high in fiber, such as whole grains, beans, apples, broccoli, carrots, peas, and barley. General tips   Work with your doctor to lose weight if you need to.  Avoid: ? Foods with added sugar. ? Fried foods. ? Foods with partially hydrogenated oils.  Limit alcohol intake to no more than 1 drink a day for nonpregnant women and 2 drinks a day for men. One drink equals 12 oz of beer, 5 oz of wine, or 1 oz of hard liquor. Reading food labels  Check food labels for: ? Trans fats. ? Partially hydrogenated oils. ? Saturated fat (g) in each serving. ? Cholesterol (mg) in each serving. ? Fiber (g) in each serving.  Choose foods with healthy fats, such as: ? Monounsaturated fats. ? Polyunsaturated fats. ? Omega-3 fats.  Choose grain products that have whole grains. Look for the word "whole" as the first word in the ingredient list. Cooking  Cook foods using low-fat methods. These include baking, boiling, grilling, and broiling.  Eat more home-cooked foods. Eat at restaurants and buffets less often.  Avoid cooking using saturated fats, such as butter, cream, palm oil, palm kernel oil, and  coconut oil. Recommended foods  Fruits  All fresh, canned (in natural juice), or frozen fruits. Vegetables  Fresh or frozen vegetables (raw, steamed, roasted, or grilled). Green salads. Grains  Whole grains, such as whole wheat or whole grain breads, crackers, cereals, and pasta. Unsweetened oatmeal, bulgur, barley, quinoa, or brown rice. Corn or whole wheat flour tortillas. Meats and other protein foods  Ground beef (85% or leaner), grass-fed  beef, or beef trimmed of fat. Skinless chicken or [place]. Ground chicken or [place]. Pork trimmed of fat. All fish and seafood. Egg whites. Dried beans, peas, or lentils. Unsalted nuts or seeds. Unsalted canned beans. Nut butters without added sugar or oil. Dairy  Low-fat or nonfat dairy products, such as skim or 1% milk, 2% or reduced-fat cheeses, low-fat and fat-free ricotta or cottage cheese, or plain low-fat and nonfat yogurt. Fats and oils  Tub margarine without trans fats. Light or reduced-fat mayonnaise and salad dressings. Avocado. Olive, canola, sesame, or safflower oils. The items listed above may not be a complete list of foods and beverages you can eat. Contact a dietitian for more information. Foods to avoid Fruits  Canned fruit in heavy syrup. Fruit in cream or butter sauce. Fried fruit. Vegetables  Vegetables cooked in cheese, cream, or butter sauce. Fried vegetables. Grains  White bread. White pasta. White rice. Cornbread. Bagels, pastries, and croissants. Crackers and snack foods that contain trans fat and hydrogenated oils. Meats and other protein foods  Fatty cuts of meat. Ribs, chicken wings, bacon, sausage, bologna, salami, chitterlings, fatback, hot dogs, bratwurst, and packaged lunch meats. Liver and organ meats. Whole eggs and egg yolks. Chicken and [place] with skin. Fried meat. Dairy  Whole or 2% milk, cream, half-and-half, and cream cheese. Whole milk cheeses. Whole-fat or sweetened yogurt. Full-fat cheeses. Nondairy creamers and whipped toppings. Processed cheese, cheese spreads, and cheese curds. Beverages  Alcohol. Sugar-sweetened drinks such as sodas, lemonade, and fruit drinks. Fats and oils  Butter, stick margarine, lard, shortening, ghee, or bacon fat. Coconut, palm kernel, and palm oils. Sweets and desserts  Corn syrup, sugars, honey, and molasses. Candy. Jam and jelly. Syrup. Sweetened cereals. Cookies, pies, cakes, donuts, muffins, and ice  cream. The items listed above may not be a complete list of foods and beverages you should avoid. Contact a dietitian for more information. Summary  Choosing the right foods helps keep your fat and cholesterol at normal levels. This can keep you from getting certain diseases.  At meals, fill one-half of your plate with vegetables and green salads.  Eat high-fiber foods, like whole grains, beans, apples, carrots, peas, and barley.  Limit added sugar, saturated fats, alcohol, and fried foods. This information is not intended to replace advice given to you by your health care provider. Make sure you discuss any questions you have with your health care provider. Document Revised: 03/18/2018 Document Reviewed: 04/01/2017 Elsevier Patient Education  2020 [employer].

## 2020-08-15 ENCOUNTER — Other Ambulatory Visit: Payer: BC Managed Care – PPO

## 2020-08-16 ENCOUNTER — Other Ambulatory Visit: Payer: Self-pay

## 2020-08-16 ENCOUNTER — Other Ambulatory Visit: Payer: BC Managed Care – PPO

## 2020-08-16 DIAGNOSIS — Z20822 Contact with and (suspected) exposure to covid-19: Secondary | ICD-10-CM

## 2020-08-17 LAB — SARS-COV-2, NAA 2 DAY TAT

## 2020-08-17 LAB — NOVEL CORONAVIRUS, NAA: SARS-CoV-2, NAA: NOT DETECTED

## 2020-08-18 ENCOUNTER — Telehealth: Payer: Self-pay | Admitting: *Deleted

## 2020-08-18 NOTE — Telephone Encounter (Signed)
Pt notified of negative COVID-19 results. Understanding verbalized.   

## 2021-06-18 ENCOUNTER — Other Ambulatory Visit: Payer: Self-pay | Admitting: Family

## 2021-06-18 DIAGNOSIS — I10 Essential (primary) hypertension: Secondary | ICD-10-CM

## 2021-06-18 DIAGNOSIS — I25118 Atherosclerotic heart disease of native coronary artery with other forms of angina pectoris: Secondary | ICD-10-CM

## 2021-06-18 DIAGNOSIS — E785 Hyperlipidemia, unspecified: Secondary | ICD-10-CM

## 2021-06-18 DIAGNOSIS — K219 Gastro-esophageal reflux disease without esophagitis: Secondary | ICD-10-CM

## 2021-06-18 NOTE — Telephone Encounter (Signed)
Rx request sent to pharmacy.  

## 2021-09-12 ENCOUNTER — Other Ambulatory Visit: Payer: Self-pay | Admitting: Family

## 2021-09-12 DIAGNOSIS — I10 Essential (primary) hypertension: Secondary | ICD-10-CM

## 2021-09-12 DIAGNOSIS — E785 Hyperlipidemia, unspecified: Secondary | ICD-10-CM

## 2021-09-12 DIAGNOSIS — K219 Gastro-esophageal reflux disease without esophagitis: Secondary | ICD-10-CM

## 2021-09-12 DIAGNOSIS — I25118 Atherosclerotic heart disease of native coronary artery with other forms of angina pectoris: Secondary | ICD-10-CM

## 2021-09-24 NOTE — Progress Notes (Signed)
Cardiology Office Note  Date:  09/25/2021   ID:  Harry [name], DOB 1966-02-10, MRN [id number]  PCP:  Patient, [name] Per (Inactive)   Chief Complaint  Patient presents with   12 month follow up     "Doing well." Medications reviewed by the patient verbally.     HPI:  Harry [name] is a very pleasant 56 year old gentleman,  nonsmoker, no diabetes,  [place] farm,  Hx of chest pain,  CAD, catheterization showing severe proximal LAD disease. 09/27/2013, DES placed. He presents for routine follow-up of his coronary artery disease  LOV 10/20 Seen by other cardiac provider 11/21  Recent stressors, separation/divorce, has a preteen child Doing well at work,  Denies chest pain concerning for angina Weight stable  Blood pressure stable, high end of range today  Continues to work at [employer] on Lipitor 40 mg daily  Previous had leg swelling on amlodipine, medication was held and symptoms resolved Tolerating losartan and metoprolol  EKG personally reviewed by myself on todays visit Shows sinus bradycardia rate 99 bpm no significant ST or T-wave changes    PMH:   has a past medical history of Hyperlipidemia and MI (myocardial infarction) (HCC).  PSH:    Past Surgical History:  Procedure Laterality Date   CARDIAC CATHETERIZATION     [facility]; x1 Stent   CORONARY ANGIOPLASTY WITH STENT PLACEMENT     drug eluting stent placement to the mid LAD.    MOHS SURGERY     ROTATOR CUFF REPAIR Right    SHOULDER SURGERY Right     Current Outpatient Medications  Medication Sig Dispense Refill   aspirin 81 MG tablet Take 81 mg by mouth daily.     atorvastatin (LIPITOR) 40 MG tablet Take 1 tablet (40 mg total) by mouth daily. Please schedule appointment with Cardiology for refills. 90 tablet 0   losartan (COZAAR) 100 MG tablet Take 1 tablet (100 mg total) by mouth daily. Please schedule appointment with Cardiology for refills. 90 tablet 0   metoprolol succinate (TOPROL-XL) 25 MG 24 hr  tablet Take 1 tablet (25 mg total) by mouth daily. Please schedule appointment with Cardiology for refills. 90 tablet 0   Multiple Vitamin (MULTIVITAMIN) tablet Take 1 tablet by mouth daily.     omeprazole (PRILOSEC) 20 MG capsule Take 1 capsule (20 mg total) by mouth daily. Please schedule appointment with Cardiology for refills. 90 capsule 0   nitroGLYCERIN (NITROSTAT) 0.4 MG SL tablet Place 1 tablet (0.4 mg total) under the tongue every 5 (five) minutes as needed for chest pain. (Patient not taking: Reported on 09/25/2021) 25 tablet 6   No current facility-administered medications for this visit.     Allergies:   Patient has no known allergies.   Social History:  The patient  reports that he has never smoked. His smokeless tobacco use includes chew. He reports current alcohol use. He reports that he does not use drugs.   Family History:   family history includes CAD in an other family member; Heart attack in his father; Hypertension in his father and mother.    Review of Systems: Review of Systems  Constitutional: Negative.   Respiratory: Negative.    Cardiovascular: Negative.   Gastrointestinal: Negative.   Musculoskeletal: Negative.   Neurological: Negative.   Psychiatric/Behavioral: Negative.    All other systems reviewed and are negative.  PHYSICAL EXAM: VS:  BP 140/86 (BP Location: Left Arm, Patient Position: Sitting, Cuff Size: Normal)    Pulse  99    Ht 5\' 10"  (1.778 m)    Wt 205 lb 8 oz (93.2 kg)    SpO2 99%    BMI 29.49 kg/m  , BMI Body mass index is 29.49 kg/m. Constitutional:  oriented to person, place, and time. No distress.  HENT:  Head: Grossly normal Eyes:  no discharge. No scleral icterus.  Neck: No JVD, no carotid bruits  Cardiovascular: Regular rate and rhythm, no murmurs appreciated Pulmonary/Chest: Clear to auscultation bilaterally, no wheezes or rails Abdominal: Soft.  no distension.  no tenderness.  Musculoskeletal: Normal range of motion Neurological:   normal muscle tone. Coordination normal. No atrophy Skin: Skin warm and dry Psychiatric: normal affect, pleasant  Recent Labs: No results found for requested labs within last 8760 hours.    Lipid Panel Lab Results  Component Value Date   CHOL 133 01/07/2014   HDL 34 (L) 01/07/2014   LDLCALC 61 01/07/2014   TRIG 191 (H) 01/07/2014      Wt Readings from Last 3 Encounters:  09/25/21 205 lb 8 oz (93.2 kg)  06/07/20 196 lb (88.9 kg)  05/04/19 197 lb (89.4 kg)      ASSESSMENT AND PLAN:  Coronary artery disease of native artery of native heart with stable angina pectoris (HCC) -  Currently with no symptoms of angina. No further workup at this time. Continue current medication regimen.   Mixed hyperlipidemia -  He will have lab work done through work, recommend he try to send 07/04/19 a copy when this is available  Essential hypertension - Blood pressure is well controlled on today's visit. No changes made to the medications.    Total encounter time more than 30 minutes  Greater than 50% was spent in counseling and coordination of care with the patient   Orders Placed This Encounter  Procedures   EKG 12-Lead     Signed, [place], M.D., Ph.D. 09/25/2021  [facility] [place], [place]

## 2021-09-25 ENCOUNTER — Other Ambulatory Visit: Payer: Self-pay

## 2021-09-25 ENCOUNTER — Encounter: Payer: Self-pay | Admitting: Cardiovascular Disease

## 2021-09-25 ENCOUNTER — Ambulatory Visit (INDEPENDENT_AMBULATORY_CARE_PROVIDER_SITE_OTHER): Payer: BC Managed Care – PPO | Admitting: Cardiovascular Disease

## 2021-09-25 VITALS — BP 140/86 | HR 99 | Ht 70.0 in | Wt 205.5 lb

## 2021-09-25 DIAGNOSIS — E785 Hyperlipidemia, unspecified: Secondary | ICD-10-CM | POA: Diagnosis not present

## 2021-09-25 DIAGNOSIS — K219 Gastro-esophageal reflux disease without esophagitis: Secondary | ICD-10-CM

## 2021-09-25 DIAGNOSIS — I10 Essential (primary) hypertension: Secondary | ICD-10-CM | POA: Diagnosis not present

## 2021-09-25 DIAGNOSIS — I25118 Atherosclerotic heart disease of native coronary artery with other forms of angina pectoris: Secondary | ICD-10-CM | POA: Diagnosis not present

## 2021-09-25 MED ORDER — METOPROLOL SUCCINATE ER 25 MG PO TB24: 25.0000 mg | ORAL_TABLET | Freq: Every day | ORAL | 3 refills | Status: DC

## 2021-09-25 MED ORDER — NITROGLYCERIN 0.4 MG SL SUBL: 0.4000 mg | SUBLINGUAL_TABLET | SUBLINGUAL | 1 refills | Status: DC | PRN

## 2021-09-25 MED ORDER — LOSARTAN POTASSIUM 100 MG PO TABS: 100.0000 mg | ORAL_TABLET | Freq: Every day | ORAL | 3 refills | Status: DC

## 2021-09-25 MED ORDER — ATORVASTATIN CALCIUM 40 MG PO TABS: 40.0000 mg | ORAL_TABLET | Freq: Every day | ORAL | 3 refills | Status: DC

## 2021-09-25 NOTE — Patient Instructions (Signed)
Medication Instructions:  No changes  Refills needed  If you need a refill on your cardiac medications before your next appointment, please call your pharmacy.   Lab work: No new labs needed  Testing/Procedures: No new testing needed  Follow-Up: At [facility], you and your health needs are our priority.  As part of our continuing mission to provide you with exceptional heart care, we have created designated Provider Care Teams.  These Care Teams include your primary Cardiologist (physician) and Advanced Practice Providers (APPs -  Physician Assistants and Nurse Practitioners) who all work together to provide you with the care you need, when you need it.  You will need a follow up appointment in 12 months  Providers on your designated Care Team:   Nicolasa Ducking, NP Eula Listen, PA-C Cadence Fransico Michael, [place]  COVID-19 Vaccine Information can be found at: [email or web address].[email or web address] For questions related to vaccine distribution or appointments, please email [email or web address] or call [phone].

## 2024-02-18 NOTE — Progress Notes (Signed)
 Subjective:    Chief Complaint  Patient presents with  . Hematuria    Blood in urine visualized in toilet bowl about 10am today; bright red color; Denies any pain;  Denies N/V/D While urinating to provide us  with a sample the patient states mid stream a red clot like substance expelled from his penis into the toilet; continues to deny pain    History was provided by the patient. Harry [name] is a 58 y.o., male  who presents with complaints of episodes of gross hematuria since this morning.  Denies previous episodes.  Reports urinated at 3 AM, and 5 AM, was clear normal.  Now with above.  Patient reports [name], no routine lab work or routine care for many years.  No complaints of headache or dizziness; no chest pain or shortness of breath; no nausea, vomiting, diarrhea; no flank pain; no fever or chills. Symptoms include: above   Patient denies: above Treatment to date: above   Past Medical History:  Diagnosis Date  . CAD (coronary artery disease)   . Hyperlipidemia   . Hypertension    The following portions of the patient's history were reviewed and updated as appropriate: allergies, current medications, past social history, and problem list.  Review of Systems A complete review of systems was performed.  Positive and pertinent negative responses are documented in the HPI, and all other systems are negative.   Objective:     Vitals:   02/18/24 1424 02/18/24 1427  BP: (!) 163/102 (!) 170/90  Pulse: 94   Temp: 36.7 C (98 F)   TempSrc: Oral   SpO2: 96%   Weight: 95.3 kg (210 lb)   Height: 177.8 cm (5' 10)   PainSc: 0-No pain     General Appearance:  Well-developed, well-nourished.  Pleasant and cooperative.  No acute distress. HEENT:  [place]/AT, PERRLA, EOMI, sclera clear, conjunctivae pink.   Neck:  Supple, no JVD or adenopathy. Cardiovascular:  Regular rate and rhythm.  Lungs:  Clear to auscultation.  Abdomen:  Soft, nontender, nondistended. Normoactive bowel  sounds.  Back: No CVA tenderness. Extremities:  No cyanosis, clubbing, or edema. Neuro:  Alert and orient x4; nonfocal.    Lab/X-ray/Treatments:   UA-3+ glucose, 3+ blood, WBCs Urine culture-pending  CMP-glucose 113, calcium  8.6, alk phos 131 TSH-pending CBC-unremarkable       Assessment:      1. Hematuria, unspecified type -     Urinalysis w/Microscopic -     Comprehensive Metabolic Panel (CMP) -     Thyroid Stimulating-Hormone (TSH) -     CBC w/auto Differential (5 Part) -     Urine Culture, Routine - [facility] -     ciprofloxacin HCl (CIPRO) 500 MG tablet; Take 1 tablet (500 mg total) by mouth 2 (two) times daily for 10 days  Dispense: 20 tablet; Refill: 0  2. Glucosuria -     Comprehensive Metabolic Panel (CMP) -     Thyroid Stimulating-Hormone (TSH) -     CBC w/auto Differential (5 Part) -     Urine Culture, Routine - [facility]  Will treat for presumptive infectious etiology.  Urine culture pending.  Cipro twice daily for 10 days.  No longer taking a statin.  Caution regarding sunburn.  Does not have PCP at this time.  Assuming good response to Cipro, will need repeat urinalysis in 7 to 10 days.  If unable to access elsewhere, may contact us  for order for same.  If clear, may follow-up with PCP  when established.  If hematuria persists, will refer to urology.  Call, return, ED if problems.     Plan:   Requested Prescriptions   Signed Prescriptions Disp Refills  . ciprofloxacin HCl (CIPRO) 500 MG tablet 20 tablet 0    Sig: Take 1 tablet (500 mg total) by mouth 2 (two) times daily for 10 days

## 2024-05-04 NOTE — Progress Notes (Unsigned)
 Cardiology Office Note    Date:  05/05/2024   ID:  Harry [name], DOB 12-19-1965, MRN [id number]  PCP:  Harry [name], [name] Per  Cardiologist:  Evalene Lunger, MD  Electrophysiologist:  None   Chief Complaint: Follow up  History of Present Illness:   Harry [name] is a 58 y.o. male with history of coronary artery disease, hyperlipidemia, hypertension, and GERD who presents for overdue follow up on CAD.     He had a treadmill MPI 08/2013 with no significant ischemia. 1 mm ST depression in V5 and V6 at peak stress with attenuation imagine showing apical thinning. He continued to have chest pain and presented to the ED 09/2013 with NSTEMI. He underwent cardiac catheterization with severe mid LAD disease with PCI/DES to the mid LAD. He was previously intolerant to amlodipine  due to lower extremity swelling.    Harry [name] was most recently seen in our office by Dr. Gollan 08/2021 overall doing well from a cardiac perspective without further testing or medication changes indicated at that time.   Harry [name] presents today overall doing well from a cardiac perspective. He reports that he has not been taking his cardiac medications in over a year aside from ASA 81 mg daily. He has had increased life stressors over the past few years due to separation from his wife. They are now back together. He recently had a work physical and his blood pressure was very high which worried him and prompted him to make an appointment with our office. He is without symptoms of angina and cardiac decompensation. He denies chest pain, shortness of breath, lightheadedness, dizziness, and lower extremity swelling.   Labs independently reviewed: 01/2024-Hgb 16.7, HCT 47.1, platelets 181, sodium 139, potassium 3.7, BUN 10, creatinine 0.9, normal LFTs, TSH wnl  Objective   Past Medical History:  Diagnosis Date   Hyperlipidemia    MI (myocardial infarction) (HCC)     Current Medications: Current Meds  Medication  Sig   aspirin 81 MG tablet Take 81 mg by mouth daily.   Multiple Vitamin (MULTIVITAMIN) tablet Take 1 tablet by mouth daily.   [DISCONTINUED] omeprazole  (PRILOSEC) 20 MG capsule Take 1 capsule (20 mg total) by mouth daily. Please schedule appointment with Cardiology for refills. (Harry [name] taking differently: Take 20 mg by mouth every other day. Please schedule appointment with Cardiology for refills.)    Allergies:   Harry [name] has no known allergies.   Social History   Socioeconomic History   Marital status: Married    Spouse name: Not on file   Number of children: Not on file   Years of education: Not on file   Highest education level: Not on file  Occupational History   Not on file  Tobacco Use   Smoking status: Never   Smokeless tobacco: Current    Types: Chew  Vaping Use   Vaping status: Never Used  Substance and Sexual Activity   Alcohol use: Yes    Comment: occasional   Drug use: No   Sexual activity: Not on file  Other Topics Concern   Not on file  Social History Narrative   Not on file   Social Drivers of Health   Financial Resource Strain: Low Risk  (08/20/2023)   Received from [facility]   Overall Financial Resource Strain (CARDIA)    Difficulty of Paying Living Expenses: Not hard at all  Food Insecurity: No Food Insecurity (08/20/2023)   Received from [facility]   Hunger  Vital Sign    Within the past 12 months, you worried that your food would run out before you got the money to buy more.: Never true    Within the past 12 months, the food you bought just didn't last and you didn't have money to get more.: Never true  Transportation Needs: No Transportation Needs (08/20/2023)   [facility] from [facility] - Transportation    In the past 12 months, has lack of transportation kept you from medical appointments or from getting medications?: No    Lack of Transportation (Non-Medical): No  Physical  Activity: Not on file  Stress: Not on file  Social Connections: Not on file     Family History:  The Harry [name]'s family history includes CAD in an other family member; Heart attack in his father; Hypertension in his father and mother.  ROS:   12-point review of systems is negative unless otherwise noted in the HPI.  EKGs/Other Studies Reviewed:    Studies reviewed were summarized above. The additional studies were reviewed today:  09/2013 LHC Severe one-vessel CAD with 99% stenosis of the mid LAD with TIMI grade III flow through the vessel and DES placement to the mid LAD with 0% residual stenosis.  Mildly elevated LVEDP.  EF estimated at 65%.  EKG:  EKG personally reviewed by me today EKG Interpretation Date/Time:  Wednesday May 05 2024 08:18:17 EDT Ventricular Rate:  85 PR Interval:  152 QRS Duration:  76 QT Interval:  342 QTC Calculation: 406 R Axis:   59  Text Interpretation: Normal sinus rhythm Normal ECG When compared with ECG of 27-Sep-2013 10:31, T wave inversion no longer evident in Inferior leads T wave inversion no longer evident in Anterolateral leads Confirmed by Lorene Sinclair ([id number]) on 05/05/2024 8:22:44 AM  PHYSICAL EXAM:    VS:  BP (!) 152/100 (BP Location: Left Arm, Harry [name] Position: Sitting)   Pulse 85   Ht 5' 9 (1.753 m)   Wt 206 lb 8 oz (93.7 kg)   SpO2 96%   BMI 30.49 kg/m   BMI: Body mass index is 30.49 kg/m.  GEN: Well nourished, well developed in no acute distress NECK: No JVD; No carotid bruits CARDIAC: RRR, no murmurs, rubs, gallops RESPIRATORY:  Clear to auscultation without rales, wheezing or rhonchi  ABDOMEN: Soft, non-tender, non-distended EXTREMITIES: No edema; No deformity  Wt Readings from Last 3 Encounters:  05/05/24 206 lb 8 oz (93.7 kg)  09/25/21 205 lb 8 oz (93.2 kg)  06/07/20 196 lb (88.9 kg)                  ASSESSMENT & PLAN:   Coronary artery disease - S/p PCI/DES to the LAD in 2015. He is without symptoms of  angina and cardiac decompensation. He unfortunately self discontinued all of his medications over a year ago aside from ASA 81 mg daily which he is continued on. Recommend resuming atorvastatin  40 mg daily.   Hypertension - BP elevated at 140/100 and 152/100 on recheck. He has been off medications for over a year as above. Recommend resuming losartan  at 50 mg daily and metoprolol  succinate 25 mg daily. He has a BP cuff at work and will monitor and record readings there. Will need to check BMP in 1-2 weeks.   Hyperlipidemia - No lipid panel in our system since 2015. Update lipid panel today. Resume atorvastatin  40 mg daily.      Disposition: Start atorvastatin  40 mg daily, losartan   50 mg daily, and metoprolol  succinate 25 mg daily. Check lipid panel. Follow up BMP in 1-2 weeks. F/u with Dr. Gollan or an APP in 2 months.   Medication Adjustments/Labs and Tests Ordered: Current medicines are reviewed at length with the Harry [name] today.  Concerns regarding medicines are outlined above. Medication changes, Labs and Tests ordered today are summarized above and listed in the Harry [name] Instructions accessible in Encounters.   Bonney Lesley Maffucci, PA-C 05/05/2024 9:04 AM     [facility] -  [address] [place], [place] [address] [phone]

## 2024-05-05 ENCOUNTER — Encounter: Payer: Self-pay | Admitting: Nurse Practitioner

## 2024-05-05 ENCOUNTER — Ambulatory Visit: Attending: Nurse Practitioner | Admitting: Physician Assistant

## 2024-05-05 VITALS — BP 152/100 | HR 85 | Ht 69.0 in | Wt 206.5 lb

## 2024-05-05 DIAGNOSIS — K219 Gastro-esophageal reflux disease without esophagitis: Secondary | ICD-10-CM

## 2024-05-05 DIAGNOSIS — I10 Essential (primary) hypertension: Secondary | ICD-10-CM

## 2024-05-05 DIAGNOSIS — E782 Mixed hyperlipidemia: Secondary | ICD-10-CM | POA: Diagnosis not present

## 2024-05-05 DIAGNOSIS — E785 Hyperlipidemia, unspecified: Secondary | ICD-10-CM | POA: Diagnosis not present

## 2024-05-05 DIAGNOSIS — Z79899 Other long term (current) drug therapy: Secondary | ICD-10-CM

## 2024-05-05 DIAGNOSIS — I2510 Atherosclerotic heart disease of native coronary artery without angina pectoris: Secondary | ICD-10-CM

## 2024-05-05 MED ORDER — METOPROLOL SUCCINATE ER 25 MG PO TB24
25.0000 mg | ORAL_TABLET | Freq: Every day | ORAL | 3 refills | Status: AC
Start: 2024-05-05 — End: ?

## 2024-05-05 MED ORDER — OMEPRAZOLE 20 MG PO CPDR: 20.0000 mg | DELAYED_RELEASE_CAPSULE | Freq: Every day | ORAL | 3 refills | Status: AC

## 2024-05-05 MED ORDER — ATORVASTATIN CALCIUM 40 MG PO TABS: 40.0000 mg | ORAL_TABLET | Freq: Every day | ORAL | 3 refills | Status: AC

## 2024-05-05 MED ORDER — LOSARTAN POTASSIUM 50 MG PO TABS: 50.0000 mg | ORAL_TABLET | Freq: Every day | ORAL | 3 refills | Status: AC

## 2024-05-05 MED ORDER — NITROGLYCERIN 0.4 MG SL SUBL
0.4000 mg | SUBLINGUAL_TABLET | SUBLINGUAL | 0 refills | Status: AC | PRN
Start: 2024-05-05 — End: ?

## 2024-05-05 NOTE — Patient Instructions (Signed)
 Medication Instructions:   Your physician recommends the following medication changes. Please take the following medications as prescribed:  Atorvastatin  40 mg once daily Losartan  50 mg once daily Metoprolol  Succinate 25 mg once daily Omprazole 20 mg once daily Nitroglycerin  0.4 mg as needed  *If you need a refill on your cardiac medications before your next appointment, please call your pharmacy*  Lab Work:  Your provider would like for you to return in 2 Weeks to have the following labs drawn: BMP and Lipid Panel.   Please go to [facility] [address] ([facility] [address], [place] [address] You do not need an appointment.  They are open from 8 am- 4:30 pm.  Lunch from 1:00 pm- 2:00 pm You DO need to be fasting.   You may also go to one of the following [facility]:  [address] [place], [place] [address] Phone: [phone] Lab hours: Mon-Fri 8 am- 5 pm    Lunch 12 pm- 1 pm  [address] [place],  [place]  [address]  [place]  Phone: [phone] Lab hours: 7 am- 4 pm Lunch 12 pm-1 pm   [address] [place],  [place]  [address]  [place]  Phone: [phone] Lab hours: Mon-Fri 8 am- 5 pm    Lunch 12 pm- 1 pm   If you have labs (blood work) drawn today and your tests are completely normal, you will receive your results only by:  MyChart Message (if you have MyChart) OR  A paper copy in the mail If you have any lab test that is abnormal or we need to change your treatment, we will call you to review the results.  Testing/Procedures:  None ordered at this time   Referrals:  None ordered at this time   Follow-Up:  At [facility], you and your health needs are our priority.  As part of our continuing mission to provide you with exceptional heart care, our providers are all part of one team.  This team includes your primary Cardiologist (physician) and Advanced Practice Providers or APPs (Physician Assistants and Nurse Practitioners) who all  work together to provide you with the care you need, when you need it.  Your next appointment:   2 month(s)  Provider:    You may see Timothy Gollan, MD or one of the following Advanced Practice Providers on your designated Care Team:   Lonni Meager, NP Lesley Maffucci, PA-C Bernardino Bring, PA-C Cadence Chesterland, PA-C Tylene Lunch, NP Barnie Hila, NP    We recommend signing up for the patient portal called MyChart.  Sign up information is provided on this After Visit Summary.  MyChart is used to connect with patients for Virtual Visits (Telemedicine).  Patients are able to view lab/test results, encounter notes, upcoming appointments, etc.  Non-urgent messages can be sent to your provider as well.   To learn more about what you can do with MyChart, go to [email or web address].

## 2024-07-06 NOTE — Progress Notes (Deleted)
 Cardiology Office Note:    Date:  07/06/2024   ID:  Harry [name], DOB 06/24/1966, MRN [id number]  PCP:  Patient, [name] Per   [facility] Providers Cardiologist:  Evalene Lunger, MD { Click to update primary MD,subspecialty MD or APP then REFRESH:1}    Referring MD: No ref. provider found   Chief complaint: 42-month follow-up     History of Present Illness:   Harry [name] is a 58 y.o. male with a hx of coronary artery disease, hyperlipidemia, hypertension, and GERD who presents for follow-up of CAD and HTN.  No significant ischemia on stress test in February 2015.  In March 2015 patient presented to the ED with chest pain and an NSTEMI.  Subsequent cardiac catheterization showing severe mid LAD disease with PCI/DES to mid LAD.  Previously intolerant to amlodipine  secondary to lower extremity edema.  Was seen most recently in the cardiology office in October 2025 for an overdue follow-up where he stated he had not been taking his cardiac medications for over a year outside of aspirin 81 mg daily.  He was hypertensive during that visit, they restarted his atorvastatin  40 mg daily, losartan  50 mg daily, and metoprolol  succinate 25 mg daily.  CAD s/p PCI mLAD 2015 EKG *** Symptoms Continue ASA 81 mg daily Continue atorvastatin  40 mg daily Order CBC to check platelets  HTN BPs? Continue losartan  50 mg daily Continue metoprolol  succinate 25 mg daily Order BMP to check kidney function  HLD Need to update fasting lipid panel Continue atorvastatin  40 mg daily  ROS:   Please see the history of present illness.    *** All other systems reviewed and are negative.     Past Medical History:  Diagnosis Date   Hyperlipidemia    MI (myocardial infarction) (HCC)     Past Surgical History:  Procedure Laterality Date   CARDIAC CATHETERIZATION     [facility]; x1 Stent   CORONARY ANGIOPLASTY WITH STENT PLACEMENT     drug eluting stent placement to the mid LAD.     MOHS SURGERY     ROTATOR CUFF REPAIR Right    SHOULDER SURGERY Right     Current Medications: No outpatient medications have been marked as taking for the 07/07/24 encounter (Appointment) with Vivienne Lonni Ingle, NP.     Allergies:   Patient has no known allergies.   Social History   Socioeconomic History   Marital status: Married    Spouse name: Not on file   Number of children: Not on file   Years of education: Not on file   Highest education level: Not on file  Occupational History   Not on file  Tobacco Use   Smoking status: Never   Smokeless tobacco: Current    Types: Chew  Vaping Use   Vaping status: Never Used  Substance and Sexual Activity   Alcohol use: Yes    Comment: occasional   Drug use: No   Sexual activity: Not on file  Other Topics Concern   Not on file  Social History Narrative   Not on file   Social Drivers of Health   Financial Resource Strain: Low Risk  (08/20/2023)   Received from [facility]   Overall Financial Resource Strain (CARDIA)    Difficulty of Paying Living Expenses: Not hard at all  Food Insecurity: No Food Insecurity (08/20/2023)   Received from [facility]   Hunger Vital Sign    Within the past  12 months, you worried that your food would run out before you got the money to buy more.: Never true    Within the past 12 months, the food you bought just didn't last and you didn't have money to get more.: Never true  Transportation Needs: No Transportation Needs (08/20/2023)   Received from [facility] - Transportation    In the past 12 months, has lack of transportation kept you from medical appointments or from getting medications?: No    Lack of Transportation (Non-Medical): No  Physical Activity: Not on file  Stress: Not on file  Social Connections: Not on file     Family History: The patient's ***family history includes CAD in an other family member; Heart  attack in his father; Hypertension in his father and mother.  EKGs/Labs/Other Studies Reviewed:    The following studies were reviewed today: ***      Recent Labs: No results found for requested labs within last 365 days.  Recent Lipid Panel    Component Value Date/Time   CHOL 133 01/07/2014 0834   CHOL 185 09/26/2013 0508   TRIG 191 (H) 01/07/2014 0834   TRIG 712 (H) 09/26/2013 0508   HDL 34 (L) 01/07/2014 0834   HDL 24 (L) 09/26/2013 0508   CHOLHDL 3.9 01/07/2014 0834   VLDL SEE COMMENT 09/26/2013 0508   LDLCALC 61 01/07/2014 0834   LDLCALC SEE COMMENT 09/26/2013 0508     Risk Assessment/Calculations:   {Does this patient have ATRIAL FIBRILLATION?:[phone]}  No BP recorded.  {Refresh Note OR Click here to enter BP  :1}***         Physical Exam:    VS:  There were no vitals taken for this visit.       Wt Readings from Last 3 Encounters:  05/05/24 206 lb 8 oz (93.7 kg)  09/25/21 205 lb 8 oz (93.2 kg)  06/07/20 196 lb (88.9 kg)     GEN: *** Well nourished, well developed in no acute distress HEENT: Normal NECK: No JVD; No carotid bruits CARDIAC: *** S1-S2 normal, RRR, no murmurs, rubs, gallops RESPIRATORY:  Clear to auscultation without rales, wheezing or rhonchi  MUSCULOSKELETAL:  No edema; No deformity  SKIN: Warm and dry NEUROLOGIC:  Alert and oriented x 3 PSYCHIATRIC:  Normal affect       Assessment & Plan        {Are you ordering a CV Procedure (e.g. stress test, cath, DCCV, TEE, etc)?   Press F2        :[id number]}    Medication Adjustments/Labs and Tests Ordered: Current medicines are reviewed at length with the patient today.  Concerns regarding medicines are outlined above.  No orders of the defined types were placed in this encounter.  No orders of the defined types were placed in this encounter.   There are no Patient Instructions on file for this visit.   Signed, Miriam FORBES Shams, NP  07/06/2024 6:08 PM    [facility]

## 2024-07-07 ENCOUNTER — Ambulatory Visit: Admitting: Nurse Practitioner

## 2024-07-07 ENCOUNTER — Encounter: Payer: Self-pay | Admitting: Nurse Practitioner

## 2024-07-16 ENCOUNTER — Ambulatory Visit: Attending: Nurse Practitioner | Admitting: Nurse Practitioner

## 2024-07-16 ENCOUNTER — Encounter: Payer: Self-pay | Admitting: Nurse Practitioner

## 2024-07-16 VITALS — BP 120/80 | HR 86 | Ht 64.0 in | Wt 207.2 lb

## 2024-07-16 DIAGNOSIS — I2510 Atherosclerotic heart disease of native coronary artery without angina pectoris: Secondary | ICD-10-CM | POA: Diagnosis not present

## 2024-07-16 DIAGNOSIS — R7401 Elevation of levels of liver transaminase levels: Secondary | ICD-10-CM | POA: Diagnosis not present

## 2024-07-16 DIAGNOSIS — I10 Essential (primary) hypertension: Secondary | ICD-10-CM | POA: Diagnosis not present

## 2024-07-16 DIAGNOSIS — E785 Hyperlipidemia, unspecified: Secondary | ICD-10-CM

## 2024-07-16 NOTE — Progress Notes (Signed)
 "    Office Visit    Patient Name: Harry [name] Date of Encounter: 07/16/2024  Primary Care Provider:  Patient, [name] Per Primary Cardiologist:  Evalene Lunger, MD    Chief Complaint    58 y.o. male with a history of CAD, hypertension, hyperlipidemia, and GERD, who presents for hypertension follow-up.  Past Medical History   Subjective   Past Medical History:  Diagnosis Date   CAD (coronary artery disease)    a. 08/2013 MV: No ischemia; b. 09/2013 anterior STEMI/PCI: LM nl, LAD 80m (3.5x18 Xience DES), LCX nl, RCA nl   GERD (gastroesophageal reflux disease)    Hyperlipidemia LDL goal <70    MI (myocardial infarction) (HCC)    Primary hypertension    Past Surgical History:  Procedure Laterality Date   CARDIAC CATHETERIZATION     [facility]; x1 Stent   CORONARY ANGIOPLASTY WITH STENT PLACEMENT     drug eluting stent placement to the mid LAD.    MOHS SURGERY     ROTATOR CUFF REPAIR Right    SHOULDER SURGERY Right     Allergies  Allergies[1]     History of Present Illness      58 y.o. y/o male with a history of CAD, hypertension, hyperlipidemia, and GERD.  In the setting of chest pain in 2015, he underwent stress testing which was notable for 1 mm of ST segment depression in leads V5 and V6 at peak stress with attenuation imaging showing apical thinning.  He was initially medically managed but continued to have chest pain and presented to the emergency department March 2015 and ruled in for non-STEMI.  Diagnostic catheterization revealed severe mid LAD disease which was successfully treated with a drug-eluting stent.   Mr. Albaugh was last seen in cardiology clinic in October 2025 at which time he reported doing well but did come off of all of his cardiac medications.  He was recently made aware of elevated blood pressures at a work physical and blood pressure was elevated at 152/100 that day.  He was placed back on prior doses of metoprolol  succinate 25 mg daily, losartan   50 mg daily, and atorvastatin  40 mg daily.  Since last visit, he has felt well.  He does check his blood pressure at home and he says it took about 2 weeks but pressure has since normalized.  He is very active, hunting every weekend.  Earlier in the fall, he took a trip to [place]  to hunt for a week.  He denies chest pain, dyspnea, palpitation, PND, orthopnea, dizziness, syncope, edema, or early satiety. Objective   Home Medications    Current Outpatient Medications  Medication Sig Dispense Refill   aspirin 81 MG tablet Take 81 mg by mouth daily.     atorvastatin  (LIPITOR) 40 MG tablet Take 1 tablet (40 mg total) by mouth daily. 90 tablet 3   losartan  (COZAAR ) 50 MG tablet Take 1 tablet (50 mg total) by mouth daily. 90 tablet 3   metoprolol  succinate (TOPROL -XL) 25 MG 24 hr tablet Take 1 tablet (25 mg total) by mouth daily. 90 tablet 3   Multiple Vitamin (MULTIVITAMIN) tablet Take 1 tablet by mouth daily.     nitroGLYCERIN  (NITROSTAT ) 0.4 MG SL tablet Place 1 tablet (0.4 mg total) under the tongue every 5 (five) minutes as needed for chest pain. 25 tablet 0   omeprazole  (PRILOSEC) 20 MG capsule Take 1 capsule (20 mg total) by mouth daily. 90 capsule 3   No current facility-administered  medications for this visit.     Physical Exam    VS:  BP 120/80 (BP Location: Left Arm, Patient Position: Sitting, Cuff Size: Normal)   Pulse 86   Ht 5' 4 (1.626 m)   Wt 207 lb 4 oz (94 kg)   SpO2 98%   BMI 35.57 kg/m  , BMI Body mass index is 35.57 kg/m.          GEN: Well nourished, well developed, in no acute distress. HEENT: normal. Neck: Supple, no JVD, carotid bruits, or masses. Cardiac: RRR, no murmurs, rubs, or gallops. No clubbing, cyanosis, edema.  Radials 2+/PT 2+ and equal bilaterally.  Respiratory:  Respirations regular and unlabored, clear to auscultation bilaterally. GI: Soft, nontender, nondistended, BS + x 4. MS: no deformity or atrophy. Skin: warm and dry, no rash. Neuro:   Strength and sensation are intact. Psych: Normal affect.  Accessory Clinical Findings     Lab Results  Component Value Date   WBC 8.4 09/27/2013   HGB 14.9 09/27/2013   HCT 45.1 09/27/2013   MCV 92 09/27/2013   PLT 149 (L) 09/27/2013   Lab Results  Component Value Date   CREATININE 1.00 09/28/2013   BUN 11 09/28/2013   NA 139 09/28/2013   K 4.3 09/28/2013   CL 106 09/28/2013   CO2 26 09/28/2013   Lab Results  Component Value Date   ALT 63 (H) 01/07/2014   AST 38 01/07/2014   ALKPHOS 136 (H) 01/07/2014   BILITOT 1.1 01/07/2014   Lab Results  Component Value Date   CHOL 133 01/07/2014   HDL 34 (L) 01/07/2014   LDLCALC 61 01/07/2014   TRIG 191 (H) 01/07/2014   CHOLHDL 3.9 01/07/2014    Labs dated May 03, 2024 from [facility]:  Hemoglobin 18.5, hematocrit 54.9, WBC 6.4, platelets 192 Sodium 138, potassium 4.4, chloride 102, CO2 20, BUN 15, creatinine 1.09, glucose 103 Total protein 7.6, albumin 4.7, calcium  9.2 Total bilirubin 0.7,: Phosphatase 164, AST 41, ALT 64 Total cholesterol 240, triglycerides 334, HDL 40, LDL 139 TSH 2.870, free T4 1.06        Assessment & Plan    1.  CAD: Status post non-STEMI and PCI and stenting of the mid LAD in March 2015.  He remains active, hunting most weekends and walking quite a bit at work and also exercising about 2 days a week.  He denies chest pain or dyspnea.  He remains on aspirin, statin, beta-blocker, and ARB.  Providing lab slips for follow-up lipids and LFTs today.  2.  Hypertension: Much better following resumption of beta-blocker and ARB therapy earlier this fall.  Pressure was 120/80 today.  Tolerating medications well.  3.  Hyperlipidemia: Prior to resuming atorvastatin  in October, his LDL cholesterol was 139 with total cholesterol 240 and triglycerides of 334.  LFTs were also mildly elevated with an AST of 41 and ALT of 64.  Providing lab slips today for follow-up lipids and LFTs.  4.  Transaminitis: Mildly  abnormal LFTs in October prior to resuming atorvastatin  therapy.  Alkaline phosphatase was 164 with an AST of 41 and ALT of 64.  He is asymptomatic.  Follow-up LFTs today-lab slip provided as patient prefers to have labs drawn at outpatient [facility] instead of here due to cost.  5.  Disposition: Follow-up in clinic in 6 months or sooner if necessary.  Lonni Meager, NP 07/16/2024, 8:32 AM     [1]  Allergies Allergen Reactions   Amlodipine   Ankle swelling   "

## 2024-07-16 NOTE — Patient Instructions (Signed)
 Medication Instructions:  Your physician recommends that you continue on your current medications as directed. Please refer to the Current Medication list given to you today.    *If you need a refill on your cardiac medications before your next appointment, please call your pharmacy*  Lab Work: Your provider would like for you to return have the following labs drawn: Lipid, CMP.   Please go to [facility] [address] ([facility] [address], [place] [address] You do not need an appointment.  They are open from 8 am- 4:30 pm.  Lunch from 1:00 pm- 2:00 pm You will need to be fasting.    Testing/Procedures: No test ordered today   Follow-Up: At [facility], you and your health needs are our priority.  As part of our continuing mission to provide you with exceptional heart care, our providers are all part of one team.  This team includes your primary Cardiologist (physician) and Advanced Practice Providers or APPs (Physician Assistants and Nurse Practitioners) who all work together to provide you with the care you need, when you need it.  Your next appointment:   6 month(s)  Provider:   Timothy Gollan, MD or Lonni Meager, NP
